# Patient Record
Sex: Female | Born: 1945 | Race: White | Hispanic: No | Marital: Single | Smoking: Current every day smoker
Health system: Southern US, Community
[De-identification: ages and names within clinical notes are randomized; demographics above are authoritative.]

## PROBLEM LIST (undated history)

## (undated) DIAGNOSIS — I10 Essential (primary) hypertension: Secondary | ICD-10-CM

## (undated) DIAGNOSIS — F419 Anxiety disorder, unspecified: Secondary | ICD-10-CM

## (undated) DIAGNOSIS — Z79899 Other long term (current) drug therapy: Secondary | ICD-10-CM

## (undated) DIAGNOSIS — G4700 Insomnia, unspecified: Secondary | ICD-10-CM

## (undated) DIAGNOSIS — K219 Gastro-esophageal reflux disease without esophagitis: Secondary | ICD-10-CM

## (undated) DIAGNOSIS — N189 Chronic kidney disease, unspecified: Secondary | ICD-10-CM

## (undated) DIAGNOSIS — E782 Mixed hyperlipidemia: Secondary | ICD-10-CM

## (undated) HISTORY — PX: OVARIAN CYST REMOVAL: SHX89

---

## 2015-08-03 DIAGNOSIS — I10 Essential (primary) hypertension: Secondary | ICD-10-CM | POA: Insufficient documentation

## 2015-08-03 DIAGNOSIS — F5101 Primary insomnia: Secondary | ICD-10-CM | POA: Insufficient documentation

## 2015-08-03 DIAGNOSIS — Z79899 Other long term (current) drug therapy: Secondary | ICD-10-CM | POA: Insufficient documentation

## 2015-08-03 DIAGNOSIS — R5381 Other malaise: Secondary | ICD-10-CM | POA: Insufficient documentation

## 2015-08-03 DIAGNOSIS — K219 Gastro-esophageal reflux disease without esophagitis: Secondary | ICD-10-CM | POA: Insufficient documentation

## 2015-08-03 DIAGNOSIS — E782 Mixed hyperlipidemia: Secondary | ICD-10-CM | POA: Insufficient documentation

## 2015-08-05 DIAGNOSIS — R5381 Other malaise: Secondary | ICD-10-CM | POA: Diagnosis not present

## 2015-08-05 DIAGNOSIS — K219 Gastro-esophageal reflux disease without esophagitis: Secondary | ICD-10-CM | POA: Diagnosis not present

## 2015-08-05 DIAGNOSIS — I10 Essential (primary) hypertension: Secondary | ICD-10-CM | POA: Diagnosis not present

## 2015-08-05 DIAGNOSIS — E782 Mixed hyperlipidemia: Secondary | ICD-10-CM | POA: Diagnosis not present

## 2015-08-05 DIAGNOSIS — F5101 Primary insomnia: Secondary | ICD-10-CM | POA: Diagnosis not present

## 2015-08-05 DIAGNOSIS — R5383 Other fatigue: Secondary | ICD-10-CM | POA: Diagnosis not present

## 2016-04-12 DIAGNOSIS — R5381 Other malaise: Secondary | ICD-10-CM | POA: Diagnosis not present

## 2016-04-12 DIAGNOSIS — E782 Mixed hyperlipidemia: Secondary | ICD-10-CM | POA: Diagnosis not present

## 2016-04-12 DIAGNOSIS — F5101 Primary insomnia: Secondary | ICD-10-CM | POA: Diagnosis not present

## 2016-04-12 DIAGNOSIS — R5383 Other fatigue: Secondary | ICD-10-CM | POA: Diagnosis not present

## 2016-04-12 DIAGNOSIS — I10 Essential (primary) hypertension: Secondary | ICD-10-CM | POA: Diagnosis not present

## 2016-04-12 DIAGNOSIS — K219 Gastro-esophageal reflux disease without esophagitis: Secondary | ICD-10-CM | POA: Diagnosis not present

## 2017-01-22 DIAGNOSIS — F5101 Primary insomnia: Secondary | ICD-10-CM | POA: Diagnosis not present

## 2017-01-22 DIAGNOSIS — E782 Mixed hyperlipidemia: Secondary | ICD-10-CM | POA: Diagnosis not present

## 2017-01-22 DIAGNOSIS — R5383 Other fatigue: Secondary | ICD-10-CM | POA: Diagnosis not present

## 2017-01-22 DIAGNOSIS — I10 Essential (primary) hypertension: Secondary | ICD-10-CM | POA: Diagnosis not present

## 2017-01-22 DIAGNOSIS — R5381 Other malaise: Secondary | ICD-10-CM | POA: Diagnosis not present

## 2017-01-22 DIAGNOSIS — K219 Gastro-esophageal reflux disease without esophagitis: Secondary | ICD-10-CM | POA: Diagnosis not present

## 2017-09-12 DIAGNOSIS — N183 Chronic kidney disease, stage 3 unspecified: Secondary | ICD-10-CM | POA: Insufficient documentation

## 2017-09-12 DIAGNOSIS — Z79899 Other long term (current) drug therapy: Secondary | ICD-10-CM | POA: Diagnosis not present

## 2017-09-12 DIAGNOSIS — Z0000 Encounter for general adult medical examination without abnormal findings: Secondary | ICD-10-CM | POA: Diagnosis not present

## 2017-09-12 DIAGNOSIS — I129 Hypertensive chronic kidney disease with stage 1 through stage 4 chronic kidney disease, or unspecified chronic kidney disease: Secondary | ICD-10-CM | POA: Diagnosis not present

## 2017-09-12 DIAGNOSIS — K219 Gastro-esophageal reflux disease without esophagitis: Secondary | ICD-10-CM | POA: Diagnosis not present

## 2017-09-12 DIAGNOSIS — R5383 Other fatigue: Secondary | ICD-10-CM | POA: Diagnosis not present

## 2017-09-12 DIAGNOSIS — E782 Mixed hyperlipidemia: Secondary | ICD-10-CM | POA: Diagnosis not present

## 2017-09-12 DIAGNOSIS — R5381 Other malaise: Secondary | ICD-10-CM | POA: Diagnosis not present

## 2017-09-12 DIAGNOSIS — F5101 Primary insomnia: Secondary | ICD-10-CM | POA: Diagnosis not present

## 2017-10-25 DIAGNOSIS — I10 Essential (primary) hypertension: Secondary | ICD-10-CM | POA: Diagnosis not present

## 2017-10-25 DIAGNOSIS — M5489 Other dorsalgia: Secondary | ICD-10-CM | POA: Diagnosis not present

## 2017-10-25 DIAGNOSIS — R52 Pain, unspecified: Secondary | ICD-10-CM | POA: Diagnosis not present

## 2017-10-25 DIAGNOSIS — R262 Difficulty in walking, not elsewhere classified: Secondary | ICD-10-CM | POA: Diagnosis not present

## 2017-10-25 DIAGNOSIS — M545 Low back pain: Secondary | ICD-10-CM | POA: Diagnosis not present

## 2017-10-25 DIAGNOSIS — I491 Atrial premature depolarization: Secondary | ICD-10-CM | POA: Diagnosis not present

## 2017-10-25 DIAGNOSIS — M5136 Other intervertebral disc degeneration, lumbar region: Secondary | ICD-10-CM | POA: Diagnosis not present

## 2017-10-25 DIAGNOSIS — R109 Unspecified abdominal pain: Secondary | ICD-10-CM | POA: Diagnosis not present

## 2017-10-25 DIAGNOSIS — M5116 Intervertebral disc disorders with radiculopathy, lumbar region: Secondary | ICD-10-CM | POA: Diagnosis not present

## 2017-10-25 DIAGNOSIS — M5416 Radiculopathy, lumbar region: Secondary | ICD-10-CM | POA: Diagnosis not present

## 2017-10-30 DIAGNOSIS — M545 Low back pain: Secondary | ICD-10-CM | POA: Diagnosis not present

## 2017-11-01 DIAGNOSIS — M5136 Other intervertebral disc degeneration, lumbar region: Secondary | ICD-10-CM | POA: Insufficient documentation

## 2017-11-01 DIAGNOSIS — I129 Hypertensive chronic kidney disease with stage 1 through stage 4 chronic kidney disease, or unspecified chronic kidney disease: Secondary | ICD-10-CM | POA: Diagnosis not present

## 2017-11-01 DIAGNOSIS — R5381 Other malaise: Secondary | ICD-10-CM | POA: Diagnosis not present

## 2017-11-01 DIAGNOSIS — R5383 Other fatigue: Secondary | ICD-10-CM | POA: Diagnosis not present

## 2017-11-01 DIAGNOSIS — Z79899 Other long term (current) drug therapy: Secondary | ICD-10-CM | POA: Diagnosis not present

## 2017-11-01 DIAGNOSIS — N183 Chronic kidney disease, stage 3 (moderate): Secondary | ICD-10-CM | POA: Diagnosis not present

## 2018-01-17 DIAGNOSIS — M545 Low back pain: Secondary | ICD-10-CM | POA: Diagnosis not present

## 2018-04-07 DIAGNOSIS — M545 Low back pain: Secondary | ICD-10-CM | POA: Diagnosis not present

## 2018-04-07 DIAGNOSIS — S0990XA Unspecified injury of head, initial encounter: Secondary | ICD-10-CM | POA: Diagnosis not present

## 2018-04-07 DIAGNOSIS — M25552 Pain in left hip: Secondary | ICD-10-CM | POA: Diagnosis not present

## 2018-04-07 DIAGNOSIS — M25551 Pain in right hip: Secondary | ICD-10-CM | POA: Diagnosis not present

## 2018-04-07 DIAGNOSIS — I10 Essential (primary) hypertension: Secondary | ICD-10-CM | POA: Diagnosis not present

## 2018-04-07 DIAGNOSIS — R262 Difficulty in walking, not elsewhere classified: Secondary | ICD-10-CM | POA: Diagnosis not present

## 2018-04-07 DIAGNOSIS — W19XXXA Unspecified fall, initial encounter: Secondary | ICD-10-CM | POA: Diagnosis not present

## 2018-04-07 DIAGNOSIS — R531 Weakness: Secondary | ICD-10-CM | POA: Diagnosis not present

## 2018-04-12 DIAGNOSIS — M5136 Other intervertebral disc degeneration, lumbar region: Secondary | ICD-10-CM | POA: Diagnosis not present

## 2018-04-12 DIAGNOSIS — I10 Essential (primary) hypertension: Secondary | ICD-10-CM | POA: Diagnosis not present

## 2018-04-12 DIAGNOSIS — F5101 Primary insomnia: Secondary | ICD-10-CM | POA: Diagnosis not present

## 2018-04-16 DIAGNOSIS — M5136 Other intervertebral disc degeneration, lumbar region: Secondary | ICD-10-CM | POA: Diagnosis not present

## 2018-04-16 DIAGNOSIS — K219 Gastro-esophageal reflux disease without esophagitis: Secondary | ICD-10-CM | POA: Diagnosis not present

## 2018-04-16 DIAGNOSIS — I129 Hypertensive chronic kidney disease with stage 1 through stage 4 chronic kidney disease, or unspecified chronic kidney disease: Secondary | ICD-10-CM | POA: Diagnosis not present

## 2018-04-16 DIAGNOSIS — R5381 Other malaise: Secondary | ICD-10-CM | POA: Diagnosis not present

## 2018-04-16 DIAGNOSIS — N183 Chronic kidney disease, stage 3 (moderate): Secondary | ICD-10-CM | POA: Diagnosis not present

## 2018-04-16 DIAGNOSIS — F5101 Primary insomnia: Secondary | ICD-10-CM | POA: Diagnosis not present

## 2018-04-16 DIAGNOSIS — R5383 Other fatigue: Secondary | ICD-10-CM | POA: Diagnosis not present

## 2018-04-17 DIAGNOSIS — Z79891 Long term (current) use of opiate analgesic: Secondary | ICD-10-CM | POA: Diagnosis not present

## 2018-04-17 DIAGNOSIS — I129 Hypertensive chronic kidney disease with stage 1 through stage 4 chronic kidney disease, or unspecified chronic kidney disease: Secondary | ICD-10-CM | POA: Diagnosis not present

## 2018-04-17 DIAGNOSIS — M5136 Other intervertebral disc degeneration, lumbar region: Secondary | ICD-10-CM | POA: Diagnosis not present

## 2018-04-17 DIAGNOSIS — F17210 Nicotine dependence, cigarettes, uncomplicated: Secondary | ICD-10-CM | POA: Diagnosis not present

## 2018-04-17 DIAGNOSIS — G4700 Insomnia, unspecified: Secondary | ICD-10-CM | POA: Diagnosis not present

## 2018-04-17 DIAGNOSIS — I700 Atherosclerosis of aorta: Secondary | ICD-10-CM | POA: Diagnosis not present

## 2018-04-17 DIAGNOSIS — Z9181 History of falling: Secondary | ICD-10-CM | POA: Diagnosis not present

## 2018-04-17 DIAGNOSIS — E782 Mixed hyperlipidemia: Secondary | ICD-10-CM | POA: Diagnosis not present

## 2018-04-17 DIAGNOSIS — N183 Chronic kidney disease, stage 3 (moderate): Secondary | ICD-10-CM | POA: Diagnosis not present

## 2018-04-22 DIAGNOSIS — M5136 Other intervertebral disc degeneration, lumbar region: Secondary | ICD-10-CM | POA: Diagnosis not present

## 2018-04-22 DIAGNOSIS — M5116 Intervertebral disc disorders with radiculopathy, lumbar region: Secondary | ICD-10-CM | POA: Diagnosis not present

## 2018-04-22 DIAGNOSIS — M5126 Other intervertebral disc displacement, lumbar region: Secondary | ICD-10-CM | POA: Diagnosis not present

## 2018-04-22 DIAGNOSIS — M48061 Spinal stenosis, lumbar region without neurogenic claudication: Secondary | ICD-10-CM | POA: Diagnosis not present

## 2018-04-25 DIAGNOSIS — M5136 Other intervertebral disc degeneration, lumbar region: Secondary | ICD-10-CM | POA: Diagnosis not present

## 2018-04-26 DIAGNOSIS — M48061 Spinal stenosis, lumbar region without neurogenic claudication: Secondary | ICD-10-CM | POA: Insufficient documentation

## 2018-04-26 DIAGNOSIS — G8929 Other chronic pain: Secondary | ICD-10-CM | POA: Insufficient documentation

## 2018-04-26 DIAGNOSIS — M545 Low back pain: Secondary | ICD-10-CM | POA: Diagnosis not present

## 2018-04-26 DIAGNOSIS — M549 Dorsalgia, unspecified: Secondary | ICD-10-CM | POA: Insufficient documentation

## 2018-04-26 DIAGNOSIS — M5136 Other intervertebral disc degeneration, lumbar region: Secondary | ICD-10-CM | POA: Diagnosis not present

## 2018-04-26 DIAGNOSIS — F419 Anxiety disorder, unspecified: Secondary | ICD-10-CM | POA: Insufficient documentation

## 2018-05-08 ENCOUNTER — Other Ambulatory Visit: Payer: Self-pay | Admitting: Neurosurgery

## 2018-05-08 DIAGNOSIS — M4310 Spondylolisthesis, site unspecified: Secondary | ICD-10-CM | POA: Diagnosis not present

## 2018-05-15 DIAGNOSIS — E782 Mixed hyperlipidemia: Secondary | ICD-10-CM | POA: Diagnosis not present

## 2018-05-15 DIAGNOSIS — I129 Hypertensive chronic kidney disease with stage 1 through stage 4 chronic kidney disease, or unspecified chronic kidney disease: Secondary | ICD-10-CM | POA: Diagnosis not present

## 2018-05-15 DIAGNOSIS — F419 Anxiety disorder, unspecified: Secondary | ICD-10-CM | POA: Diagnosis not present

## 2018-05-15 DIAGNOSIS — M5136 Other intervertebral disc degeneration, lumbar region: Secondary | ICD-10-CM | POA: Diagnosis not present

## 2018-05-15 DIAGNOSIS — R5383 Other fatigue: Secondary | ICD-10-CM | POA: Diagnosis not present

## 2018-05-15 DIAGNOSIS — R5381 Other malaise: Secondary | ICD-10-CM | POA: Diagnosis not present

## 2018-05-15 DIAGNOSIS — F5101 Primary insomnia: Secondary | ICD-10-CM | POA: Diagnosis not present

## 2018-05-15 DIAGNOSIS — Z79899 Other long term (current) drug therapy: Secondary | ICD-10-CM | POA: Diagnosis not present

## 2018-05-15 DIAGNOSIS — K219 Gastro-esophageal reflux disease without esophagitis: Secondary | ICD-10-CM | POA: Diagnosis not present

## 2018-05-15 DIAGNOSIS — N183 Chronic kidney disease, stage 3 (moderate): Secondary | ICD-10-CM | POA: Diagnosis not present

## 2018-05-16 NOTE — Pre-Procedure Instructions (Signed)
Natalie Pollard  05/16/2018      [id number] - [place], [place] - [address] AT [facility] [address] [place] [address] Phone: [phone] Fax: [phone]    Your procedure is scheduled on Friday March 27th.  Report to [facility] Admitting Entrance "A" at 9:00 A.M.  Call this number if you have problems the morning of surgery:  [phone]   Remember:  Do not eat or drink after midnight.     Take these medicines the morning of surgery with A SIP OF WATER  amLODipine (NORVASC) cloNIDine (CATAPRES) cyclobenzaprine (FLEXERIL) if needed oxyCODONE (OXY IR/ROXICODONE) if needed oxymetazoline (AFRIN) if needed  7 days prior to surgery STOP taking any Aspirin(unless otherwise instructed by your surgeon), meloxicam (MOBIC), Aleve, Naproxen, Ibuprofen, Motrin, Advil, Goody's, BC's, all herbal medications, fish oil, and all vitamins    Do not wear jewelry, make-up or nail polish.  Do not wear lotions, powders, or perfumes, or deodorant.  Do not shave 48 hours prior to surgery.  Do not bring valuables to the hospital.  [facility] is not responsible for any belongings or valuables.  Contacts, dentures or bridgework may not be worn into surgery.  Leave your suitcase in the car.  After surgery it may be brought to your room.  For patients admitted to the hospital, discharge time will be determined by your treatment team.  Patients discharged the day of surgery will not be allowed to drive home.   [place]- Preparing For Surgery  Before surgery, you can play an important role. Because skin is not sterile, your skin needs to be as free of germs as possible. You can reduce the number of germs on your skin by washing with CHG (chlorahexidine gluconate) Soap before surgery.  CHG is an antiseptic cleaner which kills germs and bonds with the skin to continue killing germs even after washing.    Oral Hygiene is also important to reduce your  risk of infection.  Remember - BRUSH YOUR TEETH THE MORNING OF SURGERY WITH YOUR REGULAR TOOTHPASTE  Please do not use if you have an allergy to CHG or antibacterial soaps. If your skin becomes reddened/irritated stop using the CHG.  Do not shave (including legs and underarms) for at least 48 hours prior to first CHG shower. It is OK to shave your face.  Please follow these instructions carefully.   1. Shower the NIGHT BEFORE SURGERY and the MORNING OF SURGERY with CHG.   2. If you chose to wash your hair, wash your hair first as usual with your normal shampoo.  3. After you shampoo, rinse your hair and body thoroughly to remove the shampoo.  4. Use CHG as you would any other liquid soap. You can apply CHG directly to the skin and wash gently with a scrungie or a clean washcloth.   5. Apply the CHG Soap to your body ONLY FROM THE NECK DOWN.  Do not use on open wounds or open sores. Avoid contact with your eyes, ears, mouth and genitals (private parts). Wash Face and genitals (private parts)  with your normal soap.  6. Wash thoroughly, paying special attention to the area where your surgery will be performed.  7. Thoroughly rinse your body with warm water from the neck down.  8. DO NOT shower/wash with your normal soap after using and rinsing off the CHG Soap.  9. Pat yourself dry with a CLEAN TOWEL.  10. Wear CLEAN  PAJAMAS to bed the night before surgery, wear comfortable clothes the morning of surgery  11. Place CLEAN SHEETS on your bed the night of your first shower and DO NOT SLEEP WITH PETS.    Day of Surgery: Shower as stated above. Do not apply any deodorants/lotions.  Please wear clean clothes to the hospital/surgery center.   Remember to brush your teeth WITH YOUR REGULAR TOOTHPASTE.   Please read over the following fact sheets that you were given.

## 2018-05-17 ENCOUNTER — Encounter (HOSPITAL_COMMUNITY): Payer: Self-pay

## 2018-05-17 ENCOUNTER — Other Ambulatory Visit: Payer: Self-pay

## 2018-05-17 ENCOUNTER — Encounter (HOSPITAL_COMMUNITY)
Admission: RE | Admit: 2018-05-17 | Discharge: 2018-05-17 | Disposition: A | Discharge status: Home / Self Care | Payer: PPO | Source: Ambulatory Visit | Attending: Neurosurgery | Admitting: Neurosurgery

## 2018-05-17 DIAGNOSIS — I10 Essential (primary) hypertension: Secondary | ICD-10-CM | POA: Diagnosis not present

## 2018-05-17 DIAGNOSIS — Z01818 Encounter for other preprocedural examination: Secondary | ICD-10-CM | POA: Diagnosis not present

## 2018-05-17 HISTORY — DX: Chronic kidney disease, unspecified: N18.9

## 2018-05-17 HISTORY — DX: Gastro-esophageal reflux disease without esophagitis: K21.9

## 2018-05-17 HISTORY — DX: Anxiety disorder, unspecified: F41.9

## 2018-05-17 HISTORY — DX: Other long term (current) drug therapy: Z79.899

## 2018-05-17 HISTORY — DX: Insomnia, unspecified: G47.00

## 2018-05-17 HISTORY — DX: Mixed hyperlipidemia: E78.2

## 2018-05-17 HISTORY — DX: Essential (primary) hypertension: I10

## 2018-05-17 LAB — CBC WITH DIFFERENTIAL/PLATELET
Abs Immature Granulocytes: 0.05 10*3/uL (ref 0.00–0.07)
Basophils Absolute: 0.1 10*3/uL (ref 0.0–0.1)
Basophils Relative: 1 %
Eosinophils Absolute: 0.1 10*3/uL (ref 0.0–0.5)
Eosinophils Relative: 1 %
HEMATOCRIT: 39.9 % (ref 36.0–46.0)
Hemoglobin: 12.8 g/dL (ref 12.0–15.0)
Immature Granulocytes: 1 %
LYMPHS ABS: 2 10*3/uL (ref 0.7–4.0)
Lymphocytes Relative: 26 %
MCH: 29.1 pg (ref 26.0–34.0)
MCHC: 32.1 g/dL (ref 30.0–36.0)
MCV: 90.7 fL (ref 80.0–100.0)
Monocytes Absolute: 0.4 10*3/uL (ref 0.1–1.0)
Monocytes Relative: 5 %
Neutro Abs: 5.1 10*3/uL (ref 1.7–7.7)
Neutrophils Relative %: 66 %
Platelets: 337 10*3/uL (ref 150–400)
RBC: 4.4 MIL/uL (ref 3.87–5.11)
RDW: 13.2 % (ref 11.5–15.5)
WBC: 7.7 10*3/uL (ref 4.0–10.5)
nRBC: 0 % (ref 0.0–0.2)

## 2018-05-17 LAB — BASIC METABOLIC PANEL
Anion gap: 12 (ref 5–15)
BUN: 11 mg/dL (ref 8–23)
CO2: 20 mmol/L — AB (ref 22–32)
Calcium: 9.4 mg/dL (ref 8.9–10.3)
Chloride: 103 mmol/L (ref 98–111)
Creatinine, Ser: 1.51 mg/dL — ABNORMAL HIGH (ref 0.44–1.00)
GFR calc Af Amer: 39 mL/min — ABNORMAL LOW (ref >60)
GFR calc non Af Amer: 34 mL/min — ABNORMAL LOW (ref >60)
GLUCOSE: 97 mg/dL (ref 70–99)
Potassium: 3.9 mmol/L (ref 3.5–5.1)
Sodium: 135 mmol/L (ref 135–145)

## 2018-05-17 LAB — TYPE AND SCREEN
ABO/RH(D): A POS
Antibody Screen: NEGATIVE

## 2018-05-17 LAB — ABO/RH: ABO/RH(D): A POS

## 2018-05-17 LAB — SURGICAL PCR SCREEN
MRSA, PCR: NEGATIVE
Staphylococcus aureus: NEGATIVE

## 2018-05-17 NOTE — Progress Notes (Signed)
PCP - Adela Lank Cardiologist - denies  Chest x-ray - N/A EKG - 05/17/18 Stress Test - denies ECHO - denies Cardiac Cath - denies  Sleep Study - denies  Aspirin Instructions: Patient instructed to hold all Aspirin, NSAID's, herbal medications, fish oil and vitamins 7 days prior to surgery.   Anesthesia review:   Patient denies shortness of breath, fever, cough and chest pain at PAT appointment   Patient verbalized understanding of instructions that were given to them at the PAT appointment. Patient was also instructed that they will need to review over the PAT instructions again at home before surgery.

## 2018-05-23 NOTE — Anesthesia Preprocedure Evaluation (Addendum)
Anesthesia Evaluation  Patient identified by MRN, date of birth, ID band Patient awake    Reviewed: Allergy & Precautions, NPO status , Patient's Chart, lab work & pertinent test results  History of Anesthesia Complications Negative for: history of anesthetic complications  Airway Mallampati: II  TM Distance: >3 FB Neck ROM: Full    Dental no notable dental hx.    Pulmonary Current Smoker,    Pulmonary exam normal        Cardiovascular hypertension, negative cardio ROS Normal cardiovascular exam     Neuro/Psych PSYCHIATRIC DISORDERS Anxiety negative neurological ROS     GI/Hepatic Neg liver ROS, GERD  ,  Endo/Other  negative endocrine ROS  Renal/GU Renal InsufficiencyRenal disease  negative genitourinary   Musculoskeletal negative musculoskeletal ROS (+)   Abdominal   Peds  Hematology negative hematology ROS (+)   Anesthesia Other Findings L4-5 PLIF - Anxiety, current smoker, HTN, HLD, CKD3 (Cr 1.51), GERD  Reproductive/Obstetrics                            Anesthesia Physical Anesthesia Plan  ASA: III  Anesthesia Plan: General   Post-op Pain Management:    Induction: Intravenous and Rapid sequence  PONV Risk Score and Plan: 2 and Ondansetron, Dexamethasone, Midazolam and Treatment may vary due to age or medical condition  Airway Management Planned: Oral ETT  Additional Equipment: None  Intra-op Plan:   Post-operative Plan: Extubation in OR  Informed Consent: I have reviewed the patients History and Physical, chart, labs and discussed the procedure including the risks, benefits and alternatives for the proposed anesthesia with the patient or authorized representative who has indicated his/her understanding and acceptance.     Dental advisory given  Plan Discussed with:   Anesthesia Plan Comments:        Anesthesia Quick Evaluation

## 2018-05-24 ENCOUNTER — Inpatient Hospital Stay (HOSPITAL_COMMUNITY): Payer: PPO | Admitting: Physician Assistant

## 2018-05-24 ENCOUNTER — Other Ambulatory Visit: Payer: Self-pay

## 2018-05-24 ENCOUNTER — Inpatient Hospital Stay (HOSPITAL_COMMUNITY): Payer: PPO

## 2018-05-24 ENCOUNTER — Inpatient Hospital Stay (HOSPITAL_COMMUNITY)
Admission: RE | Admit: 2018-05-24 | Discharge: 2018-05-25 | DRG: 455 | Disposition: A | Discharge status: Home Health | Payer: PPO | Attending: Neurosurgery | Admitting: Neurosurgery

## 2018-05-24 ENCOUNTER — Inpatient Hospital Stay (HOSPITAL_COMMUNITY): Payer: PPO | Admitting: Anesthesiology

## 2018-05-24 ENCOUNTER — Encounter (HOSPITAL_COMMUNITY)
Admission: RE | Disposition: A | Discharge status: Home Health | Payer: Self-pay | Source: Home / Self Care | Attending: Neurosurgery

## 2018-05-24 ENCOUNTER — Encounter (HOSPITAL_COMMUNITY): Payer: Self-pay | Admitting: *Deleted

## 2018-05-24 DIAGNOSIS — E782 Mixed hyperlipidemia: Secondary | ICD-10-CM | POA: Diagnosis not present

## 2018-05-24 DIAGNOSIS — K219 Gastro-esophageal reflux disease without esophagitis: Secondary | ICD-10-CM | POA: Diagnosis present

## 2018-05-24 DIAGNOSIS — N183 Chronic kidney disease, stage 3 (moderate): Secondary | ICD-10-CM | POA: Diagnosis not present

## 2018-05-24 DIAGNOSIS — Z419 Encounter for procedure for purposes other than remedying health state, unspecified: Secondary | ICD-10-CM

## 2018-05-24 DIAGNOSIS — Z881 Allergy status to other antibiotic agents status: Secondary | ICD-10-CM | POA: Diagnosis not present

## 2018-05-24 DIAGNOSIS — Z885 Allergy status to narcotic agent status: Secondary | ICD-10-CM

## 2018-05-24 DIAGNOSIS — I129 Hypertensive chronic kidney disease with stage 1 through stage 4 chronic kidney disease, or unspecified chronic kidney disease: Secondary | ICD-10-CM | POA: Diagnosis present

## 2018-05-24 DIAGNOSIS — G4700 Insomnia, unspecified: Secondary | ICD-10-CM | POA: Diagnosis not present

## 2018-05-24 DIAGNOSIS — M4316 Spondylolisthesis, lumbar region: Principal | ICD-10-CM | POA: Diagnosis present

## 2018-05-24 DIAGNOSIS — M5416 Radiculopathy, lumbar region: Secondary | ICD-10-CM | POA: Diagnosis present

## 2018-05-24 DIAGNOSIS — Z791 Long term (current) use of non-steroidal anti-inflammatories (NSAID): Secondary | ICD-10-CM

## 2018-05-24 DIAGNOSIS — M4310 Spondylolisthesis, site unspecified: Secondary | ICD-10-CM | POA: Diagnosis present

## 2018-05-24 DIAGNOSIS — M48061 Spinal stenosis, lumbar region without neurogenic claudication: Secondary | ICD-10-CM | POA: Diagnosis not present

## 2018-05-24 DIAGNOSIS — M549 Dorsalgia, unspecified: Secondary | ICD-10-CM | POA: Diagnosis present

## 2018-05-24 DIAGNOSIS — M5126 Other intervertebral disc displacement, lumbar region: Secondary | ICD-10-CM | POA: Diagnosis not present

## 2018-05-24 SURGERY — POSTERIOR LUMBAR FUSION 1 LEVEL
Anesthesia: General | Site: Back

## 2018-05-24 MED ORDER — BUPIVACAINE HCL (PF) 0.25 % IJ SOLN
INTRAMUSCULAR | Status: DC | PRN
  Administered 2018-05-24: 20 mL

## 2018-05-24 MED ORDER — LOSARTAN POTASSIUM 50 MG PO TABS
100.0000 mg | ORAL_TABLET | Freq: Every day | ORAL | Status: DC
  Administered 2018-05-25: 100 mg via ORAL
  Filled 2018-05-24: qty 2

## 2018-05-24 MED ORDER — HYDROCODONE-ACETAMINOPHEN 10-325 MG PO TABS
1.0000 | ORAL_TABLET | ORAL | Status: DC | PRN
  Administered 2018-05-24: 1 via ORAL
  Filled 2018-05-24: qty 1

## 2018-05-24 MED ORDER — BUPIVACAINE HCL (PF) 0.25 % IJ SOLN
INTRAMUSCULAR | Status: AC
  Filled 2018-05-24: qty 30

## 2018-05-24 MED ORDER — VANCOMYCIN HCL 1000 MG IV SOLR
INTRAVENOUS | Status: AC
  Filled 2018-05-24: qty 1000

## 2018-05-24 MED ORDER — SODIUM CHLORIDE 0.9% FLUSH: 3.0000 mL | INTRAVENOUS | Status: DC | PRN

## 2018-05-24 MED ORDER — HYDROCHLOROTHIAZIDE 12.5 MG PO CAPS
12.5000 mg | ORAL_CAPSULE | Freq: Every day | ORAL | Status: DC
  Administered 2018-05-25: 12.5 mg via ORAL
  Filled 2018-05-24: qty 1

## 2018-05-24 MED ORDER — ACETAMINOPHEN 325 MG PO TABS: 650.0000 mg | ORAL_TABLET | ORAL | Status: DC | PRN

## 2018-05-24 MED ORDER — FENTANYL CITRATE (PF) 100 MCG/2ML IJ SOLN
INTRAMUSCULAR | Status: DC | PRN
  Administered 2018-05-24: 100 ug via INTRAVENOUS
  Administered 2018-05-24 (×2): 50 ug via INTRAVENOUS

## 2018-05-24 MED ORDER — MENTHOL 3 MG MT LOZG: 1.0000 | LOZENGE | OROMUCOSAL | Status: DC | PRN

## 2018-05-24 MED ORDER — OXYCODONE HCL 5 MG PO TABS
ORAL_TABLET | ORAL | Status: AC
  Filled 2018-05-24: qty 2

## 2018-05-24 MED ORDER — POLYETHYLENE GLYCOL 3350 17 G PO PACK: 17.0000 g | PACK | Freq: Every day | ORAL | Status: DC | PRN

## 2018-05-24 MED ORDER — CHLORHEXIDINE GLUCONATE CLOTH 2 % EX PADS: 6.0000 | MEDICATED_PAD | Freq: Once | CUTANEOUS | Status: DC

## 2018-05-24 MED ORDER — FENTANYL CITRATE (PF) 100 MCG/2ML IJ SOLN
25.0000 ug | INTRAMUSCULAR | Status: DC | PRN
  Administered 2018-05-24 (×3): 50 ug via INTRAVENOUS

## 2018-05-24 MED ORDER — CYCLOBENZAPRINE HCL 10 MG PO TABS
10.0000 mg | ORAL_TABLET | Freq: Three times a day (TID) | ORAL | Status: DC | PRN
  Administered 2018-05-24 – 2018-05-25 (×2): 10 mg via ORAL
  Filled 2018-05-24 (×3): qty 1

## 2018-05-24 MED ORDER — SODIUM CHLORIDE 0.9 % IV SOLN
INTRAVENOUS | Status: DC | PRN
  Administered 2018-05-24: 09:00:00

## 2018-05-24 MED ORDER — CEFAZOLIN SODIUM-DEXTROSE 2-4 GM/100ML-% IV SOLN
2.0000 g | INTRAVENOUS | Status: AC
  Administered 2018-05-24: 2 g via INTRAVENOUS
  Filled 2018-05-24: qty 100

## 2018-05-24 MED ORDER — FENTANYL CITRATE (PF) 100 MCG/2ML IJ SOLN
INTRAMUSCULAR | Status: AC
  Administered 2018-05-24: 50 ug via INTRAVENOUS
  Filled 2018-05-24: qty 2

## 2018-05-24 MED ORDER — AMLODIPINE BESYLATE 10 MG PO TABS
10.0000 mg | ORAL_TABLET | Freq: Every day | ORAL | Status: DC
  Administered 2018-05-25: 10 mg via ORAL
  Filled 2018-05-24: qty 1

## 2018-05-24 MED ORDER — ONDANSETRON HCL 4 MG PO TABS
4.0000 mg | ORAL_TABLET | Freq: Four times a day (QID) | ORAL | Status: DC | PRN
  Administered 2018-05-25: 4 mg via ORAL
  Filled 2018-05-24: qty 1

## 2018-05-24 MED ORDER — FENTANYL CITRATE (PF) 250 MCG/5ML IJ SOLN
INTRAMUSCULAR | Status: AC
  Filled 2018-05-24: qty 5

## 2018-05-24 MED ORDER — ONDANSETRON HCL 4 MG/2ML IJ SOLN
INTRAMUSCULAR | Status: DC | PRN
  Administered 2018-05-24: 4 mg via INTRAVENOUS

## 2018-05-24 MED ORDER — ROCURONIUM BROMIDE 50 MG/5ML IV SOSY
PREFILLED_SYRINGE | INTRAVENOUS | Status: DC | PRN
  Administered 2018-05-24: 50 mg via INTRAVENOUS
  Administered 2018-05-24: 20 mg via INTRAVENOUS

## 2018-05-24 MED ORDER — ACETAMINOPHEN 10 MG/ML IV SOLN
INTRAVENOUS | Status: AC
  Filled 2018-05-24: qty 100

## 2018-05-24 MED ORDER — FLEET ENEMA 7-19 GM/118ML RE ENEM: 1.0000 | ENEMA | Freq: Once | RECTAL | Status: DC | PRN

## 2018-05-24 MED ORDER — CEFAZOLIN SODIUM-DEXTROSE 1-4 GM/50ML-% IV SOLN
1.0000 g | Freq: Three times a day (TID) | INTRAVENOUS | Status: AC
  Administered 2018-05-24 – 2018-05-25 (×2): 1 g via INTRAVENOUS
  Filled 2018-05-24 (×2): qty 50

## 2018-05-24 MED ORDER — DIAZEPAM 5 MG PO TABS
5.0000 mg | ORAL_TABLET | Freq: Four times a day (QID) | ORAL | Status: DC | PRN
  Administered 2018-05-25: 10 mg via ORAL
  Filled 2018-05-24: qty 2

## 2018-05-24 MED ORDER — ONDANSETRON HCL 4 MG/2ML IJ SOLN: 4.0000 mg | Freq: Once | INTRAMUSCULAR | Status: DC | PRN

## 2018-05-24 MED ORDER — OXYCODONE HCL 5 MG/5ML PO SOLN: 5.0000 mg | Freq: Once | ORAL | Status: DC | PRN

## 2018-05-24 MED ORDER — SODIUM CHLORIDE 0.9 % IV SOLN: 250.0000 mL | INTRAVENOUS | Status: DC

## 2018-05-24 MED ORDER — ACETAMINOPHEN 10 MG/ML IV SOLN
INTRAVENOUS | Status: DC | PRN
  Administered 2018-05-24: 1000 mg via INTRAVENOUS

## 2018-05-24 MED ORDER — LACTATED RINGERS IV SOLN
INTRAVENOUS | Status: DC | PRN
  Administered 2018-05-24: 07:00:00 via INTRAVENOUS

## 2018-05-24 MED ORDER — VANCOMYCIN HCL 1 G IV SOLR
INTRAVENOUS | Status: DC | PRN
  Administered 2018-05-24: 1000 mg via TOPICAL

## 2018-05-24 MED ORDER — DEXAMETHASONE SODIUM PHOSPHATE 10 MG/ML IJ SOLN
10.0000 mg | INTRAMUSCULAR | Status: AC
  Administered 2018-05-24: 10 mg via INTRAVENOUS
  Filled 2018-05-24: qty 1

## 2018-05-24 MED ORDER — ONDANSETRON HCL 4 MG/2ML IJ SOLN
4.0000 mg | Freq: Four times a day (QID) | INTRAMUSCULAR | Status: DC | PRN
  Administered 2018-05-24 – 2018-05-25 (×2): 4 mg via INTRAVENOUS
  Filled 2018-05-24 (×2): qty 2

## 2018-05-24 MED ORDER — ORAL CARE MOUTH RINSE
15.0000 mL | Freq: Two times a day (BID) | OROMUCOSAL | Status: DC
  Administered 2018-05-24: 15 mL via OROMUCOSAL

## 2018-05-24 MED ORDER — HYDROMORPHONE HCL 1 MG/ML IJ SOLN
1.0000 mg | INTRAMUSCULAR | Status: DC | PRN
  Administered 2018-05-24 (×3): 1 mg via INTRAVENOUS
  Filled 2018-05-24 (×3): qty 1

## 2018-05-24 MED ORDER — SODIUM CHLORIDE 0.9% FLUSH
3.0000 mL | Freq: Two times a day (BID) | INTRAVENOUS | Status: DC
  Administered 2018-05-24: 3 mL via INTRAVENOUS

## 2018-05-24 MED ORDER — ACETAMINOPHEN 650 MG RE SUPP: 650.0000 mg | RECTAL | Status: DC | PRN

## 2018-05-24 MED ORDER — SUGAMMADEX SODIUM 200 MG/2ML IV SOLN
INTRAVENOUS | Status: DC | PRN
  Administered 2018-05-24 (×2): 100 mg via INTRAVENOUS

## 2018-05-24 MED ORDER — CLONIDINE HCL 0.1 MG PO TABS
0.0500 mg | ORAL_TABLET | Freq: Two times a day (BID) | ORAL | Status: DC
  Administered 2018-05-24: 0.5 mg via ORAL
  Administered 2018-05-25: 0.05 mg via ORAL
  Filled 2018-05-24 (×2): qty 1

## 2018-05-24 MED ORDER — OXYCODONE HCL 5 MG PO TABS: 5.0000 mg | ORAL_TABLET | Freq: Once | ORAL | Status: DC | PRN

## 2018-05-24 MED ORDER — PROPOFOL 10 MG/ML IV BOLUS
INTRAVENOUS | Status: AC
  Filled 2018-05-24: qty 20

## 2018-05-24 MED ORDER — PHENOL 1.4 % MT LIQD: 1.0000 | OROMUCOSAL | Status: DC | PRN

## 2018-05-24 MED ORDER — PROPOFOL 10 MG/ML IV BOLUS
INTRAVENOUS | Status: DC | PRN
  Administered 2018-05-24: 140 mg via INTRAVENOUS

## 2018-05-24 MED ORDER — SUCCINYLCHOLINE CHLORIDE 20 MG/ML IJ SOLN
INTRAMUSCULAR | Status: DC | PRN
  Administered 2018-05-24: 80 mg via INTRAVENOUS

## 2018-05-24 MED ORDER — THROMBIN 20000 UNITS EX SOLR
CUTANEOUS | Status: DC | PRN
  Administered 2018-05-24: 09:00:00 via TOPICAL

## 2018-05-24 MED ORDER — BISACODYL 10 MG RE SUPP: 10.0000 mg | Freq: Every day | RECTAL | Status: DC | PRN

## 2018-05-24 MED ORDER — THROMBIN 20000 UNITS EX SOLR
CUTANEOUS | Status: AC
  Filled 2018-05-24: qty 20000

## 2018-05-24 MED ORDER — OXYCODONE HCL 5 MG PO TABS
10.0000 mg | ORAL_TABLET | ORAL | Status: DC | PRN
  Administered 2018-05-24 – 2018-05-25 (×6): 10 mg via ORAL
  Filled 2018-05-24 (×6): qty 2

## 2018-05-24 MED ORDER — OXYMETAZOLINE HCL 0.05 % NA SOLN: 1.0000 | Freq: Two times a day (BID) | NASAL | Status: DC | PRN

## 2018-05-24 MED ORDER — CLONAZEPAM 0.5 MG PO TABS
0.5000 mg | ORAL_TABLET | Freq: Every day | ORAL | Status: DC
  Administered 2018-05-24: 0.5 mg via ORAL
  Filled 2018-05-24: qty 1

## 2018-05-24 MED ORDER — LIDOCAINE 2% (20 MG/ML) 5 ML SYRINGE
INTRAMUSCULAR | Status: DC | PRN
  Administered 2018-05-24: 80 mg via INTRAVENOUS

## 2018-05-24 MED ORDER — 0.9 % SODIUM CHLORIDE (POUR BTL) OPTIME
TOPICAL | Status: DC | PRN
  Administered 2018-05-24: 1000 mL

## 2018-05-24 SURGICAL SUPPLY — 58 items
BAG DECANTER FOR FLEXI CONT (MISCELLANEOUS) ×3 IMPLANT
BENZOIN TINCTURE PRP APPL 2/3 (GAUZE/BANDAGES/DRESSINGS) ×3 IMPLANT
BLADE CLIPPER SURG (BLADE) IMPLANT
BUR CUTTER 7.0 ROUND (BURR) IMPLANT
BUR MATCHSTICK NEURO 3.0 LAGG (BURR) ×3 IMPLANT
CANISTER SUCT 3000ML PPV (MISCELLANEOUS) ×3 IMPLANT
CAP LCK SPNE (Orthopedic Implant) ×4 IMPLANT
CAP LOCK SPINE RADIUS (Orthopedic Implant) ×4 IMPLANT
CAP LOCKING (Orthopedic Implant) ×8 IMPLANT
CARTRIDGE OIL MAESTRO DRILL (MISCELLANEOUS) ×1 IMPLANT
CLOSURE WOUND 1/2 X4 (GAUZE/BANDAGES/DRESSINGS) ×1
CONT SPEC 4OZ CLIKSEAL STRL BL (MISCELLANEOUS) ×3 IMPLANT
COVER BACK TABLE 60X90IN (DRAPES) ×3 IMPLANT
COVER WAND RF STERILE (DRAPES) ×3 IMPLANT
DECANTER SPIKE VIAL GLASS SM (MISCELLANEOUS) ×3 IMPLANT
DERMABOND ADVANCED (GAUZE/BANDAGES/DRESSINGS) ×2
DERMABOND ADVANCED .7 DNX12 (GAUZE/BANDAGES/DRESSINGS) ×1 IMPLANT
DEVICE INTERBODY ELEVATE 10X23 (Cage) ×6 IMPLANT
DIFFUSER DRILL AIR PNEUMATIC (MISCELLANEOUS) ×3 IMPLANT
DRAPE C-ARM 42X72 X-RAY (DRAPES) ×6 IMPLANT
DRAPE HALF SHEET 40X57 (DRAPES) IMPLANT
DRAPE LAPAROTOMY 100X72X124 (DRAPES) ×3 IMPLANT
DRAPE SURG 17X23 STRL (DRAPES) ×12 IMPLANT
DRSG OPSITE POSTOP 4X6 (GAUZE/BANDAGES/DRESSINGS) ×3 IMPLANT
DURAPREP 26ML APPLICATOR (WOUND CARE) ×3 IMPLANT
ELECT REM PT RETURN 9FT ADLT (ELECTROSURGICAL) ×3
ELECTRODE REM PT RTRN 9FT ADLT (ELECTROSURGICAL) ×1 IMPLANT
EVACUATOR 1/8 PVC DRAIN (DRAIN) IMPLANT
GAUZE 4X4 16PLY RFD (DISPOSABLE) IMPLANT
GAUZE SPONGE 4X4 12PLY STRL (GAUZE/BANDAGES/DRESSINGS) IMPLANT
GLOVE ECLIPSE 9.0 STRL (GLOVE) ×6 IMPLANT
GLOVE EXAM NITRILE XL STR (GLOVE) IMPLANT
GOWN STRL REUS W/ TWL LRG LVL3 (GOWN DISPOSABLE) IMPLANT
GOWN STRL REUS W/ TWL XL LVL3 (GOWN DISPOSABLE) ×2 IMPLANT
GOWN STRL REUS W/TWL 2XL LVL3 (GOWN DISPOSABLE) IMPLANT
GOWN STRL REUS W/TWL LRG LVL3 (GOWN DISPOSABLE)
GOWN STRL REUS W/TWL XL LVL3 (GOWN DISPOSABLE) ×4
GRAFT BN 5X1XSPNE CVD POST DBM (Bone Implant) ×1 IMPLANT
GRAFT BONE MAGNIFUSE 1X5CM (Bone Implant) ×2 IMPLANT
KIT BASIN OR (CUSTOM PROCEDURE TRAY) ×3 IMPLANT
KIT TURNOVER KIT B (KITS) ×3 IMPLANT
MILL MEDIUM DISP (BLADE) ×3 IMPLANT
NEEDLE HYPO 22GX1.5 SAFETY (NEEDLE) ×3 IMPLANT
NS IRRIG 1000ML POUR BTL (IV SOLUTION) ×3 IMPLANT
OIL CARTRIDGE MAESTRO DRILL (MISCELLANEOUS) ×3
PACK LAMINECTOMY NEURO (CUSTOM PROCEDURE TRAY) ×3 IMPLANT
ROD RADIUS 35MM (Rod) ×6 IMPLANT
SCREW 5.75X45MM (Screw) ×12 IMPLANT
SPONGE SURGIFOAM ABS GEL 100 (HEMOSTASIS) ×3 IMPLANT
STRIP CLOSURE SKIN 1/2X4 (GAUZE/BANDAGES/DRESSINGS) ×2 IMPLANT
SUT VIC AB 0 CT1 18XCR BRD8 (SUTURE) ×1 IMPLANT
SUT VIC AB 0 CT1 8-18 (SUTURE) ×2
SUT VIC AB 2-0 CT1 18 (SUTURE) ×3 IMPLANT
SUT VIC AB 3-0 SH 8-18 (SUTURE) ×9 IMPLANT
TOWEL GREEN STERILE (TOWEL DISPOSABLE) ×3 IMPLANT
TOWEL GREEN STERILE FF (TOWEL DISPOSABLE) ×3 IMPLANT
TRAY FOLEY MTR SLVR 16FR STAT (SET/KITS/TRAYS/PACK) ×3 IMPLANT
WATER STERILE IRR 1000ML POUR (IV SOLUTION) ×3 IMPLANT

## 2018-05-24 NOTE — Evaluation (Signed)
Physical Therapy Evaluation Patient Details Name: Natalie Pollard MRN: [id number] DOB: [birth year] Today's Date: 05/24/2018   History of Present Illness  73 year old female with a 60-month history of severe lumbar pain which is progressively worsened.  Patient with radiating pain into both lower extremities left greater than right.  Patient with progressive proximal left lower extremity weakness involving her quadriceps muscle group.  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 s/p Bilateral L4-5 decompressive laminotomies with bilateral L4-5 decompressive foraminotomies 05/24/18  Clinical Impression  Patient is s/p above surgery resulting in functional limitations due to the deficits listed below (see PT Problem List). Pt is limited in safe mobility by 10/10 pain,  bilateral LE weakness, and decreased balance. Pt requires mod A for bed mobility and transfers. Based on this evaluation pt requires SNF level rehab at d/c, due to her increased pain, however  it is quite possible that she will have increased mobility with decreased pain. PT will follow back in the morning to reassess mobility and perform stair training if possible so that pt can d/c home.        Follow Up Recommendations SNF    Equipment Recommendations  3in1 (PT)    Recommendations for Other Services OT consult     Precautions / Restrictions Precautions Precautions: Back Precaution Booklet Issued: No Precaution Comments: pt able to recall 3/3 precautions at end of session Required Braces or Orthoses: Spinal Brace Spinal Brace: Lumbar corset;Applied in sitting position Restrictions Weight Bearing Restrictions: No      Mobility  Bed Mobility Overal bed mobility: Needs Assistance Bed Mobility: Sidelying to Sit;Rolling Rolling: Min assist Sidelying to sit: Mod assist       General bed mobility comments: min A to help to roll fully onto her hip and  modA for bringing he trunk to upright as she slid her LE off the bed  Transfers Overall transfer level: Needs assistance Equipment used: Rolling walker (2 wheeled);None Transfers: Sit to/from [employer] Sit to Stand: Mod assist Stand pivot transfers: Mod assist       General transfer comment: first attempt required maxA for power up, pt unable to maintain due to bilateral knee buckling, with second attempt pt able to come up with modA and stand for 20 sec unable to perform stepping due to bilateral tremors in her LE, modA for stand step transfer with HHA from therapist and vc for stepping to recliner  Ambulation/Gait         Gait velocity: unable to attempt due to pain and weakness            Balance Overall balance assessment: Needs assistance Sitting-balance support: Feet supported;No upper extremity supported Sitting balance-Leahy Scale: Fair     Standing balance support: Bilateral upper extremity supported Standing balance-Leahy Scale: Poor Standing balance comment: requires B UE support and min A of therapist                             Pertinent Vitals/Pain Pain Assessment: 0-10 Pain Score: 10-Worst pain ever Pain Location: low back Pain Descriptors / Indicators: Operative site guarding;Sharp;Shooting Pain Intervention(s): Limited activity within patient's tolerance;Monitored during session;Repositioned;RN gave pain meds during session    Home Living Family/patient expects to be discharged to:: Private residence Living Arrangements: Children Available Help at Discharge: Available PRN/intermittently Type of Home: House Home Access: Stairs to enter Entrance Stairs-Rails: Right Entrance Stairs-Number of Steps: 4 Home Layout:  One level Home Equipment: Walker - 2 wheels;Toilet riser      Prior Function Level of Independence: Needs assistance   Gait / Transfers Assistance Needed: since September 2019 has required RW for household  ambulation  ADL's / Homemaking Assistance Needed: daughter assists with bathing and dressing           Extremity/Trunk Assessment   Upper Extremity Assessment Upper Extremity Assessment: Generalized weakness;Defer to OT evaluation    Lower Extremity Assessment Lower Extremity Assessment: RLE deficits/detail;LLE deficits/detail;Generalized weakness RLE Deficits / Details: hip flexion and knee extension strength grossly assessed as 3+/5 with functional mobility as pt experienced bilateral knee buckling with standing   RLE: Unable to fully assess due to pain RLE Coordination: decreased fine motor;decreased gross motor LLE Deficits / Details: hip flexion and knee extension strength grossly assessed as 3/5 with functional mobility as pt experienced bilateral knee buckling with standing   LLE: Unable to fully assess due to pain LLE Coordination: decreased fine motor;decreased gross motor    Cervical / Trunk Assessment Cervical / Trunk Assessment: Kyphotic  Communication   Communication: No difficulties  Cognition Arousal/Alertness: Awake/alert Behavior During Therapy: Flat affect;WFL for tasks assessed/performed Overall Cognitive Status: Within Functional Limits for tasks assessed                                        General Comments General comments (skin integrity, edema, etc.): Honeycomb dressing in place with no drainage noted, Pt on 1.5L O2 via [place] with SaO2 of 98%O2, supplemental O2 removed and pt able to maintain 96%O2 with mobility, left O2 off and notified RN        Assessment/Plan    PT Assessment Patient needs continued PT services  PT Problem List Decreased strength;Decreased activity tolerance;Decreased balance;Decreased mobility;Decreased coordination;Decreased safety awareness;Pain       PT Treatment Interventions DME instruction;Gait training;Stair training;Functional mobility training;Therapeutic activities;Therapeutic exercise;Balance  training;Cognitive remediation;Patient/family education    PT Goals (Current goals can be found in the Care Plan section)  Acute Rehab PT Goals Patient Stated Goal: go home PT Goal Formulation: With patient Time For Goal Achievement: 06/07/18 Potential to Achieve Goals: Fair    Frequency Min 5X/week    AM-PAC PT "6 Clicks" Mobility  Outcome Measure Help needed turning from your back to your side while in a flat bed without using bedrails?: A Lot Help needed moving from lying on your back to sitting on the side of a flat bed without using bedrails?: A Lot Help needed moving to and from a bed to a chair (including a wheelchair)?: A Lot Help needed standing up from a chair using your arms (e.g., wheelchair or bedside chair)?: A Lot Help needed to walk in hospital room?: Total Help needed climbing 3-5 steps with a railing? : Total 6 Click Score: 10    End of Session Equipment Utilized During Treatment: Back brace Activity Tolerance: Patient limited by pain Patient left: in chair;with call bell/phone within reach;with chair alarm set Nurse Communication: Patient requests pain meds;Mobility status;Precautions;Weight bearing status PT Visit Diagnosis: Unsteadiness on feet (R26.81);Other abnormalities of gait and mobility (R26.89);Muscle weakness (generalized) (M62.81);Difficulty in walking, not elsewhere classified (R26.2);Pain Pain - part of body: (low back )    Time: 1610-9604 PT Time Calculation (min) (ACUTE ONLY): 36 min   Charges:   PT Evaluation $PT Eval Low Complexity: 1 Low PT Treatments $Therapeutic Activity: 8-22 mins  Reason Helzer B. Beverely Risen PT, DPT Acute Rehabilitation Services Pager [phone] Office [phone]   Natalie Pollard Fleet 05/24/2018, 5:42 PM

## 2018-05-24 NOTE — Op Note (Signed)
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  Bilateral L4-5 ponte osteotomies for sagittal plane restoration  L4-5 posterior lumbar interbody fusion utilizing interbody cages and locally harvested autograft  L4-5 posterior lateral arthrodesis utilizing nonsegmental pedicle screw fixation and local autograft and allograft  Surgeon:Octavia Mottola A.Haskel Dewalt, M.D.  Asst. Surgeon: Doran Durand, NP  Anesthesia: General  Indication: 73 year old female with severe back and bilateral lower extremity symptoms worsening progressively over the past few months with worsening weakness and near complete inability to ambulate.  Work-up demonstrates evidence of marked disc degeneration with degenerative spondylolisthesis and severe stenosis at L4-5.  Patient presents now for decompression and fusion in hopes of improving her symptoms.  Operative note: After induction of anesthesia, patient position prone on the Wilson frame properly padded.  Lumbar region prepped and draped sterilely.  Incision overlying L4-5.  Dissection performed bilaterally.  Retractor placed.  Fluoroscopy used.  Levels confirmed.  Bilateral decompressive laminotomies and facetectomies then performed using Leksell rongeurs Kerrison under the high-speed drill to remove the inferior two thirds of the lamina of L4 the complete inferior facet of L4 bilaterally the majority the superior facet of L5 bilaterally and the superior aspect of the lamina of L5.  Ligament flavum elevated and resected.  Facetectomies were further completed to complete ponte osteotomies bilaterally for restoration of her sagittal balance.   Epidural venous plexus was coagulated and cut.  Bilateral discectomies then performed.  Dissipates then prepared for interbody fusion.  Soft tissue removed in the interspace.  To space was distracted with the patient distractor placed on the right side.  A 10 mm Medtronic standard expandable cage was packed with locally harvested autograft and then impacted in the place.  This is expanded to its full extent.  Distractor removed patient's right side.  The space prepared on the right side.  Morselized autograft packed in their space.  A second cage was impacted into place and expanded to its full extent.  Pedicles at L4 and L5 were identified using surface landmarks and intraoperative fluoroscopy of superficial bone overlying the pedicle was removed and then using high-speed drill.  Pedicle was then probed using a pedicle all each pedicle all track was probed and found to be solidly within the bone.  Each pedicle tract was then tapped with a screw tapped and once again probed to ensure good position.  5.75 mm radius brand screws from [employer] were placed bilaterally at L4 and L5.  Final images reveal good position of the hardware and the cages at the proper operative level with normal alignment spine.  Wound is irrigated one final time.  Transverse processes and residual facets were decorticated.  Morselized allograft packets were packed posterior laterally along with a small amount of autograft was packed posterior laterally at L4-5 for later fusion.  Short segment titanium rod placed of the screws at L4 and L5.  Locking caps placed over the screws.  Locking caps then engaged with a construct under compression.  Gelfoam was placed topically for hemostasis.  Vancomycin powder was placed in the deep wound space.  Wounds and closed in layers of Vicryl sutures.  Steri-Strips and sterile dressing were applied.  No apparent complications.  Patient tolerated procedure well and she returns to the recovery room  postop.

## 2018-05-24 NOTE — H&P (Signed)
Natalie Pollard is an 73 y.o. female.   Chief Complaint: Back pain and weakness HPI: 73 year old female with a 50-month history of severe lumbar pain which is progressively worsened.  Patient with radiating pain into both lower extremities left greater than right.  Patient with progressive proximal left lower extremity weakness involving her quadriceps muscle group.  Patient with difficulty standing and walking secondary to the severe pain.  Patient with progressive sensory loss.  No bowel or bladder dysfunction.  Patient found to have symptomatic grade 1 degenerative spondylolisthesis with stenosis at L4-5.  She presents now for decompression and fusion surgery.  Past Medical History:  Diagnosis Date  . Anxiety   . Chronic kidney disease    stage 3  . GERD (gastroesophageal reflux disease)   . High risk medication use   . Hypertension   . Insomnia   . Mixed hyperlipidemia     Past Surgical History:  Procedure Laterality Date  . OVARIAN CYST REMOVAL      History reviewed. No pertinent family history. Social History:  reports that she has been smoking. She has never used smokeless tobacco. She reports that she does not drink alcohol or use drugs.  Allergies:  Allergies  Allergen Reactions  . Azithromycin Other (See Comments)    Abdominal Pain  . Codeine Nausea And Vomiting    Medications Prior to Admission  Medication Sig Dispense Refill  . amLODipine (NORVASC) 10 MG tablet Take 10 mg by mouth daily.    . clonazePAM (KLONOPIN) 0.5 MG tablet Take 0.5 mg by mouth at bedtime.    . cloNIDine (CATAPRES) 0.1 MG tablet Take 0.05 mg by mouth 2 (two) times daily.    . cyclobenzaprine (FLEXERIL) 10 MG tablet Take 10 mg by mouth 3 (three) times daily as needed for muscle spasms.    . hydrochlorothiazide (MICROZIDE) 12.5 MG capsule Take 12.5 mg by mouth daily.    Marland Kitchen losartan (COZAAR) 100 MG tablet Take 100 mg by mouth daily.    . meloxicam (MOBIC) 7.5 MG tablet Take 7.5 mg by mouth 2  (two) times daily with a meal.    . oxyCODONE (OXY IR/ROXICODONE) 5 MG immediate release tablet Take 5 mg by mouth every 8 (eight) hours.    Marland Kitchen oxymetazoline (AFRIN) 0.05 % nasal spray Place 1 spray into both nostrils 2 (two) times daily as needed for congestion.      No results found for this or any previous visit (from the past 48 hour(s)). No results found.  Patient  Natalie Pollard 05/24/2018, 7:46 AM   Pertinent items noted in HPI and remainder of comprehensive ROS otherwise negative.  Blood pressure (!) 157/65, pulse 73, temperature 97.8 F (36.6 C), temperature source Oral, resp. rate 20, height 5\' 5"  (1.651 m), weight 82.1 kg, SpO2 98 %.  Patient is awake and alert.  She is oriented and appropriate.  She is obviously very uncomfortable.  Examination of her head ears eyes nose throat is unremarked.  Chest and abdomen are benign.  Extremities are free from injury or deformity.  Neurologically she is awake and alert.  Speech is fluent.  Judgment insight are intact.  Cranial nerve function normal bilateral.  Motor examination reveals weakness of her left-sided quadriceps muscle group grading at 4-/5.  She has some mild weakness of dorsiflexion on the left as well.  Sensor examination with decreased sensation pinprick light touch in her left L4-L5 and right L5 dermatomes.  Deep tendon reflexes are normal active step her  Achilles reflexes are absent bilaterally.  No evidence of long track signs.  Gait is very antalgic.  Posture is flexed. Assessment/Plan L4-L5 grade 1 degenerative spondylolisthesis with severe lateral recess and foraminal stenosis and ongoing severe radicular pain and weakness.  Plan L4-L5 bilateral decompressive laminotomy with foraminotomies followed by posterior lumbar interbody fusion utilizing interbody cages and locally harvested autograft with posterior arthrodesis utilizing nonsegmental pedicle screw fixation.  Risks and benefits of been explained.  Patient wishes to  proceed.   Nichols Corter A. Jordan Likes, MD

## 2018-05-24 NOTE — Anesthesia Procedure Notes (Signed)

## 2018-05-24 NOTE — Brief Op Note (Signed)
05/24/2018  9:47 AM  PATIENT:  Natalie Pollard  73 y.o. female  PRE-OPERATIVE DIAGNOSIS:  Spondylolisthesis  POST-OPERATIVE DIAGNOSIS:  Spondylolisthesis  PROCEDURE:  Procedure(s) with comments: Posterior Lumbar Interbody Fusion - Lumbar Four-Lumbar Five (N/A) - Posterior Lumbar Interbody Fusion - Lumbar Four-Lumbar Five   SURGEON:  Surgeon(s) and Role:    * Julio Sicks, MD - Primary  PHYSICIAN ASSISTANT:   ASSISTANTSDoran Durand, NP   ANESTHESIA:   general  EBL:  150 mL   BLOOD ADMINISTERED:none  DRAINS: none   LOCAL MEDICATIONS USED:  MARCAINE     SPECIMEN:  No Specimen  DISPOSITION OF SPECIMEN:  N/A  COUNTS:  YES  TOURNIQUET:  * No tourniquets in log *  DICTATION: .Dragon Dictation  PLAN OF CARE: Admit to inpatient   PATIENT DISPOSITION:  PACU - hemodynamically stable.   Delay start of Pharmacological VTE agent (>24hrs) due to surgical blood loss or risk of bleeding: yes

## 2018-05-24 NOTE — TOC Initial Note (Signed)
Transition of Care [facility]) - Initial/Assessment Note    Patient Details  Name: Natalie Pollard MRN: [id number] Date of Birth: [birth year]  Transition of Care [facility]) CM/SW Contact:    Glennon Mac, RN Phone Number: 05/24/2018, 4:12 PM  Clinical Narrative:  Pt admitted on 05/24/2018 s/p Bilateral L4-5 decompressive laminotomies with bilateral L4-5 decompressive foraminotomies.  PTA, pt resided at home with daughter, who provides 24h assistance.                  Expected Discharge Plan: Home w Home Health Services Barriers to Discharge: Continued Medical Work up   Patient Goals and CMS Choice        Expected Discharge Plan and Services Expected Discharge Plan: Home w Home Health Services   Discharge Planning Services: CM Consult   Living arrangements for the past 2 months: Single Family Home                          Prior Living Arrangements/Services Living arrangements for the past 2 months: Single Family Home Lives with:: Adult Children Patient language and need for interpreter reviewed:: No Do you feel safe going back to the place where you live?: Yes      Need for Family Participation in Patient Care: No (Comment) Care giver support system in place?: Yes (comment)(Daughter/ 24h a day since September) Current home services: DME(Has RW and 3 in 1 at home) Criminal Activity/Legal Involvement Pertinent to Current Situation/Hospitalization: No - Comment as needed  Activities of Daily Living Home Assistive Devices/Equipment: Environmental consultant (specify type), Wheelchair ADL Screening (condition at time of admission) Patient's cognitive ability adequate to safely complete daily activities?: Yes Is the patient deaf or have difficulty hearing?: No Does the patient have difficulty seeing, even when wearing glasses/contacts?: No Does the patient have difficulty concentrating, remembering, or making decisions?: No Patient able to express need for assistance with ADLs?: Yes Does the  patient have difficulty dressing or bathing?: No Independently performs ADLs?: Yes (appropriate for developmental age) Does the patient have difficulty walking or climbing stairs?: Yes Weakness of Legs: Both Weakness of Arms/Hands: Both  Permission Sought/Granted Permission sought to share information with : Case Manager                Emotional Assessment Appearance:: Appears stated age   Affect (typically observed): Accepting, Appropriate Orientation: : Oriented to Self, Oriented to Place, Oriented to  Time, Oriented to Situation Alcohol / Substance Use: Not Applicable Psych Involvement: No (comment)  Admission diagnosis:  Spondylolisthesis Patient Active Problem List   Diagnosis Date Noted  . Degenerative spondylolisthesis 05/24/2018   PCP:  Krystal Clark, NP Pharmacy:   [id number] - [place],  - [address] AT [address] [place] [address] Phone: [phone] Fax: [phone]     Social Determinants of Health (SDOH) Interventions    Readmission Risk Interventions No flowsheet data found.   Quintella Baton, RN, BSN  Trauma/Neuro ICU Case Manager [phone]

## 2018-05-24 NOTE — Transfer of Care (Signed)
Immediate Anesthesia Transfer of Care Note  Patient: Natalie Pollard  Procedure(s) Performed: Posterior Lumbar Interbody Fusion - Lumbar Four-Lumbar Five (N/A Back)  Patient Location: PACU  Anesthesia Type:General  Level of Consciousness: awake, alert  and oriented  Airway & Oxygen Therapy: Patient Spontanous Breathing and Patient connected to face mask oxygen  Post-op Assessment: Report given to RN and Post -op Vital signs reviewed and stable  Post vital signs: Reviewed and stable  Last Vitals:  Vitals Value Taken Time  BP    Temp    Pulse 64 05/24/2018  9:59 AM  Resp 15 05/24/2018  9:59 AM  SpO2 96 % 05/24/2018  9:59 AM  Vitals shown include unvalidated device data.  Last Pain:  Vitals:   05/24/18 0601  TempSrc: Oral  PainSc: 10-Worst pain ever      Patients Stated Pain Goal: 5 (05/24/18 0601)  Complications: No apparent anesthesia complications

## 2018-05-24 NOTE — Progress Notes (Signed)
Orthopedic Tech Progress Note Patient Details:  Natalie Pollard 1945-09-02 [id number] Just got off the phone with [facility] and they are bringing this patient brace shortly Patient ID: Natalie Pollard, female   DOB: 09-Mar-1945, 73 y.o.   MRN: [id number]   Natalie Pollard 05/24/2018, 10:35 AM

## 2018-05-24 NOTE — Progress Notes (Signed)
Called and notified pt daughter Misty Stanley [phone] to report move from PACU to 4NP room 2

## 2018-05-25 MED ORDER — OXYCODONE HCL 10 MG PO TABS: 10.0000 mg | ORAL_TABLET | ORAL | 0 refills | Status: AC | PRN

## 2018-05-25 MED ORDER — CYCLOBENZAPRINE HCL 10 MG PO TABS: 10.0000 mg | ORAL_TABLET | Freq: Three times a day (TID) | ORAL | Status: DC | PRN

## 2018-05-25 NOTE — Discharge Summary (Signed)
Physician Discharge Summary  Patient ID: Natalie Pollard MRN: [id number] DOB/AGE: May 08, 1945 73 y.o.  Admit date: 05/24/2018 Discharge date: 05/25/2018  Admission Diagnoses: Lumbar spondylolisthesis, lumbar spinal stenosis, lumbago, lumbar radiculopathy  Discharge Diagnoses: The same Active Problems:   Degenerative spondylolisthesis   Discharged Condition: good  Hospital Course: Dr. Jordan Likes performed an L4-5 decompression, instrumentation and fusion on patient on 05/24/2018.    The patient's postoperative course was unremarkable.  On postoperative day #1 she requested discharge home.  She was given written and oral discharge instructions.  All her questions were answered.  Consults: None Significant Diagnostic Studies: None Treatments: L4-5 decompression, instrumentation and fusion Discharge Exam: Blood pressure (!) 159/66, pulse 87, temperature 98 F (36.7 C), temperature source Oral, resp. rate 18, height 5\' 5"  (1.651 m), weight 82.1 kg, SpO2 99 %. The patient is alert and pleasant.  She looks well.  Her strength is normal.  Disposition: Home   Allergies as of 05/25/2018      Reactions   Azithromycin Other (See Comments)   Abdominal Pain   Codeine Nausea And Vomiting      Medication List    STOP taking these medications   cyclobenzaprine 10 MG tablet Commonly known as:  FLEXERIL   meloxicam 7.5 MG tablet Commonly known as:  MOBIC     TAKE these medications   amLODipine 10 MG tablet Commonly known as:  NORVASC Take 10 mg by mouth daily.   clonazePAM 0.5 MG tablet Commonly known as:  KLONOPIN Take 0.5 mg by mouth at bedtime.   cloNIDine 0.1 MG tablet Commonly known as:  CATAPRES Take 0.05 mg by mouth 2 (two) times daily.   hydrochlorothiazide 12.5 MG capsule Commonly known as:  MICROZIDE Take 12.5 mg by mouth daily.   losartan 100 MG tablet Commonly known as:  COZAAR Take 100 mg by mouth daily.   Oxycodone HCl 10 MG Tabs Take 1 tablet (10 mg total)  by mouth every 4 (four) hours as needed for severe pain ((score 7 to 10)). What changed:    medication strength  how much to take  when to take this  reasons to take this   oxymetazoline 0.05 % nasal spray Commonly known as:  AFRIN Place 1 spray into both nostrils 2 (two) times daily as needed for congestion.            Durable Medical Equipment  (From admission, onward)         Start     Ordered   05/24/18 1112  DME Walker rolling  Once    Question:  Patient needs a walker to treat with the following condition  Answer:  Degenerative spondylolisthesis   05/24/18 1111   05/24/18 1112  DME 3 n 1  Once     05/24/18 1111           Signed: Cristi Loron 05/25/2018, 10:33 AM

## 2018-05-25 NOTE — Evaluation (Signed)
Occupational Therapy Evaluation Patient Details Name: Natalie PayorLinda B Pollard MRN: [id number] DOB: [birth year] Today's Date: 05/25/2018    History of Present Illness 73 year old female with a 3436-month history of severe lumbar pain which is progressively worsened.  Patient with radiating pain into both lower extremities left greater than right.  Patient with progressive proximal left lower extremity weakness involving her quadriceps muscle group.  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 s/p Bilateral L4-5 decompressive laminotomies with bilateral L4-5 decompressive foraminotomies 05/24/18   Clinical Impression   PTA, pt with decline in function since September and her daughter has been living with her and assisting with ADLs. Pt requiring supervision-Mod A for UB ADLs, Min-Mod A for LB ADLs, and Min Guard-Min A for functional transfers. Pt presenting with poor balance and cognition impacting her safety and performance of ADLs. Educating pt on back precautions, LB ADLs with AE, bed mobility, brace management, and functional transfers. Recommended pt sponge bath at sink due to high fall risk. Pt would benefit from further acute OT to facilitate safe dc. Pt planning for dc home today and will require 24/7 supervision and HHOT for further OT to optimize safety, independence with ADLs, and return to PLOF.      Follow Up Recommendations  SNF;Home health OT;Supervision/Assistance - 24 hour(Pt planning for dc home and requires HHOT)    Equipment Recommendations  None recommended by OT    Recommendations for Other Services PT consult     Precautions / Restrictions Precautions Precautions: Back Precaution Booklet Issued: No Precaution Comments: pt with poor recall of precautions and required max cues to recall no twisting or lifting Required Braces or Orthoses: Spinal Brace Spinal Brace: Lumbar corset;Applied in sitting  position Restrictions Weight Bearing Restrictions: No      Mobility Bed Mobility               General bed mobility comments: In recliner upon arrival  Transfers Overall transfer level: Needs assistance Equipment used: Rolling walker (2 wheeled);None Transfers: Sit to/from Stand Sit to Stand: Min guard;Min assist         General transfer comment: Min Guard A for safety. Min-Mod A for standing balance during ADLs. Posterior lean that pt is unaware of. Pt requiring Max cues for hand placement    Balance Overall balance assessment: Needs assistance Sitting-balance support: Feet supported;No upper extremity supported Sitting balance-Leahy Scale: Fair     Standing balance support: Bilateral upper extremity supported Standing balance-Leahy Scale: Poor Standing balance comment: requires B UE support and min A of therapist                           ADL either performed or assessed with clinical judgement   ADL Overall ADL's : Needs assistance/impaired Eating/Feeding: Independent;Sitting   Grooming: Set up;Supervision/safety;Sitting   Upper Body Bathing: Set up;Supervision/ safety;Sitting   Lower Body Bathing: Minimal assistance;Moderate assistance;Sit to/from stand;With adaptive equipment   Upper Body Dressing : Set up;Supervision/safety;Sitting;Moderate assistance Upper Body Dressing Details (indicate cue type and reason): Donned shirt with supervision. Mod A for donning brace Lower Body Dressing: Minimal assistance;Moderate assistance;Sit to/from stand;Cueing for safety;Cueing for sequencing Lower Body Dressing Details (indicate cue type and reason): Educating pt on Min A for management of AE. Min A foir standing balance. Donned pants and underwear Toilet Transfer: Minimal assistance;Min guard;Ambulation;RW(simulated to recliner)   Toileting- Clothing Manipulation and Hygiene: Minimal assistance;Sit to/from stand Toileting - ArchitectClothing Manipulation Details  (  indicate cue type and reason): Min A for stanidng balance. Educating pt on AE for toilet hygiene   Tub/Shower Transfer Details (indicate cue type and reason): Recommending pt sponge bath at sink due to poor balance and safety Functional mobility during ADLs: Minimal assistance;Min guard;Rolling walker General ADL Comments: Pt preesenting with poor balance, cognition, and strength. Poor adherance to precautions.      Vision Baseline Vision/History: Wears glasses Wears Glasses: Reading only Patient Visual Report: No change from baseline       Perception     Praxis      Pertinent Vitals/Pain Pain Assessment: Faces Faces Pain Scale: Hurts little more Pain Location: low back Pain Descriptors / Indicators: Operative site guarding;Sharp;Shooting Pain Intervention(s): Monitored during session;Limited activity within patient's tolerance;Repositioned     Hand Dominance Right   Extremity/Trunk Assessment Upper Extremity Assessment Upper Extremity Assessment: Generalized weakness   Lower Extremity Assessment Lower Extremity Assessment: Defer to PT evaluation RLE Deficits / Details: hip flexion and knee extension strength grossly assessed as 3+/5 with functional mobility as pt experienced bilateral knee buckling with standing   RLE: Unable to fully assess due to pain RLE Coordination: decreased fine motor;decreased gross motor LLE Deficits / Details: hip flexion and knee extension strength grossly assessed as 3/5 with functional mobility as pt experienced bilateral knee buckling with standing   LLE: Unable to fully assess due to pain LLE Coordination: decreased fine motor;decreased gross motor   Cervical / Trunk Assessment Cervical / Trunk Assessment: Kyphotic;Other exceptions Cervical / Trunk Exceptions: s/p back sx   Communication Communication Communication: No difficulties   Cognition Arousal/Alertness: Awake/alert Behavior During Therapy: Flat affect Overall Cognitive  Status: Impaired/Different from baseline Area of Impairment: Memory;Following commands;Safety/judgement;Awareness;Problem solving                     Memory: Decreased short-term memory;Decreased recall of precautions Following Commands: Follows one step commands inconsistently;Follows one step commands with increased time Safety/Judgement: Decreased awareness of safety;Decreased awareness of deficits Awareness: Emergent Problem Solving: Slow processing;Requires verbal cues;Difficulty sequencing General Comments: Pt with poor adhereance and recall of precautions. perseverating on topic of getting dressed to go home. Poor attention and problem solving and requiring increased cues.   General Comments  Reporting dizziness. BP stable    Exercises     Shoulder Instructions      Home Living Family/patient expects to be discharged to:: Private residence Living Arrangements: Children Available Help at Discharge: Available PRN/intermittently Type of Home: House Home Access: Stairs to enter [employer] of Steps: 4 Entrance Stairs-Rails: Right Home Layout: One level     Bathroom Shower/Tub: Chief Strategy Officer: Standard     Home Equipment: Environmental consultant - 2 wheels;Toilet riser;Shower seat          Prior Functioning/Environment Level of Independence: Needs assistance  Gait / Transfers Assistance Needed: since September 2019 has required RW for household ambulation ADL's / Homemaking Assistance Needed: daughter assists with bathing and dressing since decline   Comments: Independent prior to sept        OT Problem List: Decreased strength;Decreased range of motion;Decreased activity tolerance;Impaired balance (sitting and/or standing);Decreased knowledge of use of DME or AE;Decreased knowledge of precautions;Pain;Decreased cognition      OT Treatment/Interventions: Therapeutic exercise;Self-care/ADL training;Energy conservation;DME and/or AE  instruction;Therapeutic activities;Patient/family education    OT Goals(Current goals can be found in the care plan section) Acute Rehab OT Goals Patient Stated Goal: "Go home today" OT Goal Formulation: With patient Time For  Goal Achievement: 06/08/18 Potential to Achieve Goals: Good  OT Frequency: Min 3X/week   Barriers to D/C:            Co-evaluation              AM-PAC OT "6 Clicks" Daily Activity     Outcome Measure Help from another person eating meals?: None Help from another person taking care of personal grooming?: A Little Help from another person toileting, which includes using toliet, bedpan, or urinal?: A Little Help from another person bathing (including washing, rinsing, drying)?: A Lot Help from another person to put on and taking off regular upper body clothing?: A Lot Help from another person to put on and taking off regular lower body clothing?: A Lot 6 Click Score: 16   End of Session Equipment Utilized During Treatment: Rolling walker;Back brace Nurse Communication: Mobility status  Activity Tolerance: Patient tolerated treatment well Patient left: in chair;with call bell/phone within reach  OT Visit Diagnosis: Unsteadiness on feet (R26.81);Other abnormalities of gait and mobility (R26.89);Muscle weakness (generalized) (M62.81);Other symptoms and signs involving cognitive function;Pain Pain - part of body: (Back)                Time: 4917-9150 OT Time Calculation (min): 28 min Charges:  OT General Charges $OT Visit: 1 Visit OT Evaluation $OT Eval Moderate Complexity: 1 Mod OT Treatments $Self Care/Home Management : 8-22 mins  Afsana Liera MSOT, OTR/L Acute Rehab Pager: [phone] Office: [phone]  Theodoro Grist Daron Breeding 05/25/2018, 11:10 AM

## 2018-05-25 NOTE — TOC Transition Note (Signed)
Transition of Care [facility]) - CM/SW Discharge Note   Patient Details  Name: SANDRINE OSBERG MRN: [id number] Date of Birth: [birth year]  Transition [facility]) CM/SW Contact:  Deveron Furlong, RN Phone Number: 05/25/2018, 12:20 PM       Final next level of care: Home w Home Health Services Barriers to Discharge: Barriers Resolved   Patient Goals and CMS Choice Patient states their goals for this hospitalization and ongoing recovery are:: to go home CMS Medicare.gov Compare Post Acute Care list provided to:: Patient Choice offered to / list presented to : Patient, Adult Children       Discharge Plan and Services   Discharge Planning Services: CM Consult Post Acute Care Choice: Home Health(Pt has necessary DME including RW and 3n1.  Declines rollator)              HH Arranged: PT HH Agency: [facility]  Pt is active with [facility].  Daughter states she is instructed to call when patient arrives home.  Orders faxed to [facility] [phone]).

## 2018-05-25 NOTE — Anesthesia Postprocedure Evaluation (Signed)
Anesthesia Post Note  Patient: Natalie Pollard  Procedure(s) Performed: Posterior Lumbar Interbody Fusion - Lumbar Four-Lumbar Five (N/A Back)     Patient location during evaluation: PACU Anesthesia Type: General Level of consciousness: awake and alert Pain management: pain level controlled Vital Signs Assessment: post-procedure vital signs reviewed and stable Respiratory status: spontaneous breathing, nonlabored ventilation and respiratory function stable Cardiovascular status: blood pressure returned to baseline and stable Postop Assessment: no apparent nausea or vomiting Anesthetic complications: no    Last Vitals:  Vitals:   05/25/18 0345 05/25/18 0757  BP: 124/61 (!) 159/66  Pulse: 66 87  Resp: 18 18  Temp: 37.1 C 36.7 C  SpO2: 98% 99%     Lucretia Kern

## 2018-05-25 NOTE — Progress Notes (Signed)
Physical Therapy Treatment Patient Details Name: Natalie Pollard MRN: [id number] DOB: 1945/04/28 Today's Date: 05/25/2018    History of Present Illness 73 year old female with a 61-month history of severe lumbar pain which is progressively worsened.  Patient with radiating pain into both lower extremities left greater than right.  Patient with progressive proximal left lower extremity weakness involving her quadriceps muscle group.  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 s/p Bilateral L4-5 decompressive laminotomies with bilateral L4-5 decompressive foraminotomies 05/24/18    PT Comments    Pt still showing confusion, needing repetitive cues for safety.  Emphasis on reinforcing education, sit to stand, gait and stair training.  Pt can benefit from follow up.    Follow Up Recommendations  Home health PT     Equipment Recommendations  3in1 (PT)    Recommendations for Other Services       Precautions / Restrictions Precautions Precautions: Back Precaution Booklet Issued: No Precaution Comments: pt able to recall 3/3 precautions at end of session Required Braces or Orthoses: Spinal Brace Spinal Brace: Lumbar corset;Applied in sitting position Restrictions Weight Bearing Restrictions: No    Mobility  Bed Mobility               General bed mobility comments: discussed and demonstrated.  Transfers Overall transfer level: Needs assistance Equipment used: Rolling walker (2 wheeled) Transfers: Sit to/from Stand Sit to Stand: Min guard;Min assist(depending on surface height)         General transfer comment: cues for hand placement  Ambulation/Gait Ambulation/Gait assistance: Min guard Gait Distance (Feet): 150 Feet Assistive device: Rolling walker (2 wheeled) Gait Pattern/deviations: Step-through pattern Gait velocity: slower   General Gait Details: short, slow guarded steps.  Moderate use of  the RW, cues for posture.   Stairs Stairs: Yes Stairs assistance: Min assist Stair Management: One rail Right;Alternating pattern;Step to pattern;Forwards Number of Stairs: 3 General stair comments: needed repetitive cues for correct sequencing.  Needs assist and cues.   Wheelchair Mobility    Modified Rankin (Stroke Patients Only)       Balance Overall balance assessment: Needs assistance Sitting-balance support: Feet supported;No upper extremity supported Sitting balance-Leahy Scale: Fair     Standing balance support: Bilateral upper extremity supported Standing balance-Leahy Scale: Poor Standing balance comment: reliant on AD or external support                            Cognition Arousal/Alertness: Awake/alert Behavior During Therapy: Flat affect Overall Cognitive Status: Impaired/Different from baseline Area of Impairment: Memory;Following commands;Safety/judgement;Awareness;Problem solving                     Memory: Decreased short-term memory;Decreased recall of precautions Following Commands: Follows one step commands inconsistently;Follows one step commands with increased time Safety/Judgement: Decreased awareness of safety;Decreased awareness of deficits Awareness: Emergent Problem Solving: Slow processing;Requires verbal cues;Difficulty sequencing General Comments: Very distractible and tangential      Exercises      General Comments General comments (skin integrity, edema, etc.): Reinforced all education      Pertinent Vitals/Pain Pain Assessment: Faces Faces Pain Scale: Hurts little more Pain Location: low back Pain Descriptors / Indicators: Operative site guarding;Sharp;Shooting Pain Intervention(s): Monitored during session    Home Living Family/patient expects to be discharged to:: Private residence Living Arrangements: Children Available Help at Discharge: Available PRN/intermittently Type of Home: House Home Access:  Stairs to enter Entrance  Stairs-Rails: Right Home Layout: One level Home Equipment: Walker - 2 wheels;Toilet riser;Shower seat      Prior Function Level of Independence: Needs assistance  Gait / Transfers Assistance Needed: since September 2019 has required RW for household ambulation ADL's / Homemaking Assistance Needed: daughter assists with bathing and dressing since decline Comments: Independent prior to sept   PT Goals (current goals can now be found in the care plan section) Acute Rehab PT Goals Patient Stated Goal: "Go home today" PT Goal Formulation: With patient Time For Goal Achievement: 06/07/18 Potential to Achieve Goals: Fair Progress towards PT goals: Progressing toward goals    Frequency    Min 5X/week      PT Plan Current plan remains appropriate    Co-evaluation              AM-PAC PT "6 Clicks" Mobility   Outcome Measure  Help needed turning from your back to your side while in a flat bed without using bedrails?: A Lot Help needed moving from lying on your back to sitting on the side of a flat bed without using bedrails?: A Lot Help needed moving to and from a bed to a chair (including a wheelchair)?: A Little Help needed standing up from a chair using your arms (e.g., wheelchair or bedside chair)?: A Little Help needed to walk in hospital room?: A Little Help needed climbing 3-5 steps with a railing? : A Little 6 Click Score: 16    End of Session Equipment Utilized During Treatment: Back brace Activity Tolerance: Patient limited by pain Patient left: in chair;with call bell/phone within reach;with chair alarm set Nurse Communication: Mobility status;Precautions PT Visit Diagnosis: Other abnormalities of gait and mobility (R26.89);Pain Pain - part of body: (back)     Time: 6283-1517 PT Time Calculation (min) (ACUTE ONLY): 20 min  Charges:  $Gait Training: 8-22 mins                     05/25/2018  [place] Bing, PT Acute Rehabilitation  Services [phone]  (pager) [phone]  (office)   Eliseo Gum Shela Esses 05/25/2018, 11:53 AM

## 2018-05-25 NOTE — Progress Notes (Signed)
IV removed. D/C education given to pt, questions answered. No printed prescriptions or equipment to be delivered. Pt taken to car with all belongings.

## 2018-05-28 DIAGNOSIS — Z79891 Long term (current) use of opiate analgesic: Secondary | ICD-10-CM | POA: Diagnosis not present

## 2018-05-28 DIAGNOSIS — F17210 Nicotine dependence, cigarettes, uncomplicated: Secondary | ICD-10-CM | POA: Diagnosis not present

## 2018-05-28 DIAGNOSIS — E782 Mixed hyperlipidemia: Secondary | ICD-10-CM | POA: Diagnosis not present

## 2018-05-28 DIAGNOSIS — Z9181 History of falling: Secondary | ICD-10-CM | POA: Diagnosis not present

## 2018-05-28 DIAGNOSIS — N183 Chronic kidney disease, stage 3 (moderate): Secondary | ICD-10-CM | POA: Diagnosis not present

## 2018-05-28 DIAGNOSIS — Z7902 Long term (current) use of antithrombotics/antiplatelets: Secondary | ICD-10-CM | POA: Diagnosis not present

## 2018-05-28 DIAGNOSIS — G4700 Insomnia, unspecified: Secondary | ICD-10-CM | POA: Diagnosis not present

## 2018-05-28 DIAGNOSIS — I700 Atherosclerosis of aorta: Secondary | ICD-10-CM | POA: Diagnosis not present

## 2018-05-28 DIAGNOSIS — M48061 Spinal stenosis, lumbar region without neurogenic claudication: Secondary | ICD-10-CM | POA: Diagnosis not present

## 2018-05-28 DIAGNOSIS — M4316 Spondylolisthesis, lumbar region: Secondary | ICD-10-CM | POA: Diagnosis not present

## 2018-05-28 DIAGNOSIS — M5136 Other intervertebral disc degeneration, lumbar region: Secondary | ICD-10-CM | POA: Diagnosis not present

## 2018-05-28 DIAGNOSIS — I129 Hypertensive chronic kidney disease with stage 1 through stage 4 chronic kidney disease, or unspecified chronic kidney disease: Secondary | ICD-10-CM | POA: Diagnosis not present

## 2018-05-28 DIAGNOSIS — Z4789 Encounter for other orthopedic aftercare: Secondary | ICD-10-CM | POA: Diagnosis not present

## 2018-05-28 MED FILL — Heparin Sodium (Porcine) Inj 1000 Unit/ML: INTRAMUSCULAR | Qty: 30 | Status: AC

## 2018-05-28 MED FILL — Sodium Chloride IV Soln 0.9%: INTRAVENOUS | Qty: 1000 | Status: AC

## 2018-06-27 DIAGNOSIS — I700 Atherosclerosis of aorta: Secondary | ICD-10-CM | POA: Diagnosis not present

## 2018-06-27 DIAGNOSIS — F17210 Nicotine dependence, cigarettes, uncomplicated: Secondary | ICD-10-CM | POA: Diagnosis not present

## 2018-06-27 DIAGNOSIS — Z79891 Long term (current) use of opiate analgesic: Secondary | ICD-10-CM | POA: Diagnosis not present

## 2018-06-27 DIAGNOSIS — G4700 Insomnia, unspecified: Secondary | ICD-10-CM | POA: Diagnosis not present

## 2018-06-27 DIAGNOSIS — Z4789 Encounter for other orthopedic aftercare: Secondary | ICD-10-CM | POA: Diagnosis not present

## 2018-06-27 DIAGNOSIS — E782 Mixed hyperlipidemia: Secondary | ICD-10-CM | POA: Diagnosis not present

## 2018-06-27 DIAGNOSIS — M4316 Spondylolisthesis, lumbar region: Secondary | ICD-10-CM | POA: Diagnosis not present

## 2018-06-27 DIAGNOSIS — M5136 Other intervertebral disc degeneration, lumbar region: Secondary | ICD-10-CM | POA: Diagnosis not present

## 2018-06-27 DIAGNOSIS — I129 Hypertensive chronic kidney disease with stage 1 through stage 4 chronic kidney disease, or unspecified chronic kidney disease: Secondary | ICD-10-CM | POA: Diagnosis not present

## 2018-06-27 DIAGNOSIS — Z7902 Long term (current) use of antithrombotics/antiplatelets: Secondary | ICD-10-CM | POA: Diagnosis not present

## 2018-06-27 DIAGNOSIS — M48061 Spinal stenosis, lumbar region without neurogenic claudication: Secondary | ICD-10-CM | POA: Diagnosis not present

## 2018-06-27 DIAGNOSIS — Z9181 History of falling: Secondary | ICD-10-CM | POA: Diagnosis not present

## 2018-06-27 DIAGNOSIS — N183 Chronic kidney disease, stage 3 (moderate): Secondary | ICD-10-CM | POA: Diagnosis not present

## 2018-07-03 DIAGNOSIS — M4310 Spondylolisthesis, site unspecified: Secondary | ICD-10-CM | POA: Diagnosis not present

## 2018-07-25 DIAGNOSIS — R5381 Other malaise: Secondary | ICD-10-CM | POA: Diagnosis not present

## 2018-07-25 DIAGNOSIS — N183 Chronic kidney disease, stage 3 (moderate): Secondary | ICD-10-CM | POA: Diagnosis not present

## 2018-07-25 DIAGNOSIS — I129 Hypertensive chronic kidney disease with stage 1 through stage 4 chronic kidney disease, or unspecified chronic kidney disease: Secondary | ICD-10-CM | POA: Diagnosis not present

## 2018-07-25 DIAGNOSIS — E782 Mixed hyperlipidemia: Secondary | ICD-10-CM | POA: Diagnosis not present

## 2018-07-25 DIAGNOSIS — K219 Gastro-esophageal reflux disease without esophagitis: Secondary | ICD-10-CM | POA: Diagnosis not present

## 2018-07-25 DIAGNOSIS — R5383 Other fatigue: Secondary | ICD-10-CM | POA: Diagnosis not present

## 2018-07-25 DIAGNOSIS — F419 Anxiety disorder, unspecified: Secondary | ICD-10-CM | POA: Diagnosis not present

## 2018-07-25 DIAGNOSIS — F5101 Primary insomnia: Secondary | ICD-10-CM | POA: Diagnosis not present

## 2018-07-25 DIAGNOSIS — M5136 Other intervertebral disc degeneration, lumbar region: Secondary | ICD-10-CM | POA: Diagnosis not present

## 2018-08-27 DIAGNOSIS — M4310 Spondylolisthesis, site unspecified: Secondary | ICD-10-CM | POA: Diagnosis not present

## 2018-08-27 DIAGNOSIS — R293 Abnormal posture: Secondary | ICD-10-CM | POA: Diagnosis not present

## 2018-08-27 DIAGNOSIS — M79605 Pain in left leg: Secondary | ICD-10-CM | POA: Diagnosis not present

## 2018-08-27 DIAGNOSIS — M79604 Pain in right leg: Secondary | ICD-10-CM | POA: Diagnosis not present

## 2018-08-27 DIAGNOSIS — R2689 Other abnormalities of gait and mobility: Secondary | ICD-10-CM | POA: Diagnosis not present

## 2018-08-29 DIAGNOSIS — M4310 Spondylolisthesis, site unspecified: Secondary | ICD-10-CM | POA: Diagnosis not present

## 2018-08-29 DIAGNOSIS — R293 Abnormal posture: Secondary | ICD-10-CM | POA: Diagnosis not present

## 2018-08-29 DIAGNOSIS — R2689 Other abnormalities of gait and mobility: Secondary | ICD-10-CM | POA: Diagnosis not present

## 2018-08-29 DIAGNOSIS — M79604 Pain in right leg: Secondary | ICD-10-CM | POA: Diagnosis not present

## 2018-08-29 DIAGNOSIS — M79605 Pain in left leg: Secondary | ICD-10-CM | POA: Diagnosis not present

## 2018-09-11 DIAGNOSIS — Z6830 Body mass index (BMI) 30.0-30.9, adult: Secondary | ICD-10-CM | POA: Diagnosis not present

## 2018-09-11 DIAGNOSIS — M4310 Spondylolisthesis, site unspecified: Secondary | ICD-10-CM | POA: Diagnosis not present

## 2018-09-11 DIAGNOSIS — I10 Essential (primary) hypertension: Secondary | ICD-10-CM | POA: Diagnosis not present

## 2018-10-01 DIAGNOSIS — M4310 Spondylolisthesis, site unspecified: Secondary | ICD-10-CM | POA: Diagnosis not present

## 2018-10-01 DIAGNOSIS — R293 Abnormal posture: Secondary | ICD-10-CM | POA: Diagnosis not present

## 2018-10-01 DIAGNOSIS — M79605 Pain in left leg: Secondary | ICD-10-CM | POA: Diagnosis not present

## 2018-10-01 DIAGNOSIS — R2689 Other abnormalities of gait and mobility: Secondary | ICD-10-CM | POA: Diagnosis not present

## 2018-10-01 DIAGNOSIS — M79604 Pain in right leg: Secondary | ICD-10-CM | POA: Diagnosis not present

## 2018-10-23 DIAGNOSIS — Z6828 Body mass index (BMI) 28.0-28.9, adult: Secondary | ICD-10-CM | POA: Diagnosis not present

## 2018-10-23 DIAGNOSIS — I10 Essential (primary) hypertension: Secondary | ICD-10-CM | POA: Diagnosis not present

## 2018-10-23 DIAGNOSIS — M4310 Spondylolisthesis, site unspecified: Secondary | ICD-10-CM | POA: Diagnosis not present

## 2018-10-28 DIAGNOSIS — I129 Hypertensive chronic kidney disease with stage 1 through stage 4 chronic kidney disease, or unspecified chronic kidney disease: Secondary | ICD-10-CM | POA: Diagnosis not present

## 2018-10-28 DIAGNOSIS — M5136 Other intervertebral disc degeneration, lumbar region: Secondary | ICD-10-CM | POA: Diagnosis not present

## 2018-10-28 DIAGNOSIS — K219 Gastro-esophageal reflux disease without esophagitis: Secondary | ICD-10-CM | POA: Diagnosis not present

## 2018-10-28 DIAGNOSIS — Z720 Tobacco use: Secondary | ICD-10-CM | POA: Diagnosis not present

## 2018-10-28 DIAGNOSIS — F5101 Primary insomnia: Secondary | ICD-10-CM | POA: Diagnosis not present

## 2018-10-28 DIAGNOSIS — Z23 Encounter for immunization: Secondary | ICD-10-CM | POA: Diagnosis not present

## 2018-10-28 DIAGNOSIS — Z789 Other specified health status: Secondary | ICD-10-CM | POA: Diagnosis not present

## 2018-10-28 DIAGNOSIS — E782 Mixed hyperlipidemia: Secondary | ICD-10-CM | POA: Diagnosis not present

## 2018-10-28 DIAGNOSIS — R5381 Other malaise: Secondary | ICD-10-CM | POA: Diagnosis not present

## 2018-10-28 DIAGNOSIS — N183 Chronic kidney disease, stage 3 (moderate): Secondary | ICD-10-CM | POA: Diagnosis not present

## 2018-10-28 DIAGNOSIS — R5383 Other fatigue: Secondary | ICD-10-CM | POA: Diagnosis not present

## 2018-10-28 DIAGNOSIS — F419 Anxiety disorder, unspecified: Secondary | ICD-10-CM | POA: Diagnosis not present

## 2018-10-28 DIAGNOSIS — Z79899 Other long term (current) drug therapy: Secondary | ICD-10-CM | POA: Diagnosis not present

## 2018-10-29 DIAGNOSIS — M79604 Pain in right leg: Secondary | ICD-10-CM | POA: Diagnosis not present

## 2018-10-29 DIAGNOSIS — R293 Abnormal posture: Secondary | ICD-10-CM | POA: Diagnosis not present

## 2018-10-29 DIAGNOSIS — M4310 Spondylolisthesis, site unspecified: Secondary | ICD-10-CM | POA: Diagnosis not present

## 2018-10-29 DIAGNOSIS — R2689 Other abnormalities of gait and mobility: Secondary | ICD-10-CM | POA: Diagnosis not present

## 2018-10-29 DIAGNOSIS — M79605 Pain in left leg: Secondary | ICD-10-CM | POA: Diagnosis not present

## 2018-12-04 DIAGNOSIS — Z981 Arthrodesis status: Secondary | ICD-10-CM | POA: Diagnosis not present

## 2018-12-04 DIAGNOSIS — M4310 Spondylolisthesis, site unspecified: Secondary | ICD-10-CM | POA: Diagnosis not present

## 2019-05-05 DIAGNOSIS — R5381 Other malaise: Secondary | ICD-10-CM | POA: Diagnosis not present

## 2019-05-05 DIAGNOSIS — E782 Mixed hyperlipidemia: Secondary | ICD-10-CM | POA: Diagnosis not present

## 2019-05-05 DIAGNOSIS — R5383 Other fatigue: Secondary | ICD-10-CM | POA: Diagnosis not present

## 2019-05-05 DIAGNOSIS — M79605 Pain in left leg: Secondary | ICD-10-CM | POA: Diagnosis not present

## 2019-05-05 DIAGNOSIS — Z720 Tobacco use: Secondary | ICD-10-CM | POA: Insufficient documentation

## 2019-05-05 DIAGNOSIS — K219 Gastro-esophageal reflux disease without esophagitis: Secondary | ICD-10-CM | POA: Diagnosis not present

## 2019-05-05 DIAGNOSIS — M5136 Other intervertebral disc degeneration, lumbar region: Secondary | ICD-10-CM | POA: Diagnosis not present

## 2019-05-05 DIAGNOSIS — Z79899 Other long term (current) drug therapy: Secondary | ICD-10-CM | POA: Diagnosis not present

## 2019-05-05 DIAGNOSIS — F5101 Primary insomnia: Secondary | ICD-10-CM | POA: Diagnosis not present

## 2019-05-05 DIAGNOSIS — I129 Hypertensive chronic kidney disease with stage 1 through stage 4 chronic kidney disease, or unspecified chronic kidney disease: Secondary | ICD-10-CM | POA: Diagnosis not present

## 2019-05-05 DIAGNOSIS — M79604 Pain in right leg: Secondary | ICD-10-CM | POA: Diagnosis not present

## 2019-05-05 DIAGNOSIS — F419 Anxiety disorder, unspecified: Secondary | ICD-10-CM | POA: Diagnosis not present

## 2019-05-05 DIAGNOSIS — N183 Chronic kidney disease, stage 3 unspecified: Secondary | ICD-10-CM | POA: Diagnosis not present

## 2019-05-20 DIAGNOSIS — I779 Disorder of arteries and arterioles, unspecified: Secondary | ICD-10-CM | POA: Diagnosis not present

## 2019-05-20 DIAGNOSIS — M79604 Pain in right leg: Secondary | ICD-10-CM | POA: Diagnosis not present

## 2019-05-21 DIAGNOSIS — I739 Peripheral vascular disease, unspecified: Secondary | ICD-10-CM | POA: Insufficient documentation

## 2019-06-10 DIAGNOSIS — E782 Mixed hyperlipidemia: Secondary | ICD-10-CM | POA: Diagnosis not present

## 2019-06-10 DIAGNOSIS — Z720 Tobacco use: Secondary | ICD-10-CM | POA: Diagnosis not present

## 2019-06-10 DIAGNOSIS — F5101 Primary insomnia: Secondary | ICD-10-CM | POA: Diagnosis not present

## 2019-06-10 DIAGNOSIS — F419 Anxiety disorder, unspecified: Secondary | ICD-10-CM | POA: Diagnosis not present

## 2019-06-10 DIAGNOSIS — I739 Peripheral vascular disease, unspecified: Secondary | ICD-10-CM | POA: Diagnosis not present

## 2019-06-26 DIAGNOSIS — M4310 Spondylolisthesis, site unspecified: Secondary | ICD-10-CM | POA: Diagnosis not present

## 2019-07-01 DIAGNOSIS — Z981 Arthrodesis status: Secondary | ICD-10-CM | POA: Diagnosis not present

## 2019-07-01 DIAGNOSIS — M5126 Other intervertebral disc displacement, lumbar region: Secondary | ICD-10-CM | POA: Diagnosis not present

## 2019-07-01 DIAGNOSIS — M4326 Fusion of spine, lumbar region: Secondary | ICD-10-CM | POA: Diagnosis not present

## 2019-07-01 DIAGNOSIS — M4310 Spondylolisthesis, site unspecified: Secondary | ICD-10-CM | POA: Diagnosis not present

## 2019-07-01 DIAGNOSIS — M5136 Other intervertebral disc degeneration, lumbar region: Secondary | ICD-10-CM | POA: Diagnosis not present

## 2019-07-01 DIAGNOSIS — I700 Atherosclerosis of aorta: Secondary | ICD-10-CM | POA: Diagnosis not present

## 2019-07-09 DIAGNOSIS — Z6829 Body mass index (BMI) 29.0-29.9, adult: Secondary | ICD-10-CM | POA: Diagnosis not present

## 2019-07-09 DIAGNOSIS — I10 Essential (primary) hypertension: Secondary | ICD-10-CM | POA: Diagnosis not present

## 2019-07-09 DIAGNOSIS — M4310 Spondylolisthesis, site unspecified: Secondary | ICD-10-CM | POA: Diagnosis not present

## 2019-11-06 DIAGNOSIS — N183 Chronic kidney disease, stage 3 unspecified: Secondary | ICD-10-CM | POA: Diagnosis not present

## 2019-11-06 DIAGNOSIS — Z23 Encounter for immunization: Secondary | ICD-10-CM | POA: Diagnosis not present

## 2019-11-06 DIAGNOSIS — Z789 Other specified health status: Secondary | ICD-10-CM | POA: Diagnosis not present

## 2019-11-06 DIAGNOSIS — E782 Mixed hyperlipidemia: Secondary | ICD-10-CM | POA: Diagnosis not present

## 2019-11-06 DIAGNOSIS — K219 Gastro-esophageal reflux disease without esophagitis: Secondary | ICD-10-CM | POA: Diagnosis not present

## 2019-11-06 DIAGNOSIS — F419 Anxiety disorder, unspecified: Secondary | ICD-10-CM | POA: Diagnosis not present

## 2019-11-06 DIAGNOSIS — I739 Peripheral vascular disease, unspecified: Secondary | ICD-10-CM | POA: Diagnosis not present

## 2019-11-06 DIAGNOSIS — R5383 Other fatigue: Secondary | ICD-10-CM | POA: Diagnosis not present

## 2019-11-06 DIAGNOSIS — I129 Hypertensive chronic kidney disease with stage 1 through stage 4 chronic kidney disease, or unspecified chronic kidney disease: Secondary | ICD-10-CM | POA: Diagnosis not present

## 2019-11-06 DIAGNOSIS — M5136 Other intervertebral disc degeneration, lumbar region: Secondary | ICD-10-CM | POA: Diagnosis not present

## 2019-11-06 DIAGNOSIS — Z79899 Other long term (current) drug therapy: Secondary | ICD-10-CM | POA: Diagnosis not present

## 2019-11-06 DIAGNOSIS — F5101 Primary insomnia: Secondary | ICD-10-CM | POA: Diagnosis not present

## 2019-11-06 DIAGNOSIS — R5381 Other malaise: Secondary | ICD-10-CM | POA: Diagnosis not present

## 2019-11-06 DIAGNOSIS — Z0000 Encounter for general adult medical examination without abnormal findings: Secondary | ICD-10-CM | POA: Diagnosis not present

## 2019-11-12 DIAGNOSIS — M4310 Spondylolisthesis, site unspecified: Secondary | ICD-10-CM | POA: Diagnosis not present

## 2019-11-19 DIAGNOSIS — M461 Sacroiliitis, not elsewhere classified: Secondary | ICD-10-CM | POA: Diagnosis not present

## 2019-11-19 DIAGNOSIS — M47816 Spondylosis without myelopathy or radiculopathy, lumbar region: Secondary | ICD-10-CM | POA: Diagnosis not present

## 2019-11-19 DIAGNOSIS — G894 Chronic pain syndrome: Secondary | ICD-10-CM | POA: Diagnosis not present

## 2019-11-19 DIAGNOSIS — Z9889 Other specified postprocedural states: Secondary | ICD-10-CM | POA: Diagnosis not present

## 2019-12-22 DIAGNOSIS — Z9889 Other specified postprocedural states: Secondary | ICD-10-CM | POA: Diagnosis not present

## 2019-12-22 DIAGNOSIS — M47816 Spondylosis without myelopathy or radiculopathy, lumbar region: Secondary | ICD-10-CM | POA: Diagnosis not present

## 2019-12-22 DIAGNOSIS — M533 Sacrococcygeal disorders, not elsewhere classified: Secondary | ICD-10-CM | POA: Diagnosis not present

## 2020-02-12 DIAGNOSIS — M533 Sacrococcygeal disorders, not elsewhere classified: Secondary | ICD-10-CM | POA: Diagnosis not present

## 2020-03-10 DIAGNOSIS — I708 Atherosclerosis of other arteries: Secondary | ICD-10-CM | POA: Diagnosis not present

## 2020-03-10 DIAGNOSIS — I700 Atherosclerosis of aorta: Secondary | ICD-10-CM | POA: Diagnosis not present

## 2020-03-10 DIAGNOSIS — Z6829 Body mass index (BMI) 29.0-29.9, adult: Secondary | ICD-10-CM | POA: Diagnosis not present

## 2020-04-06 ENCOUNTER — Other Ambulatory Visit: Payer: Self-pay

## 2020-04-06 DIAGNOSIS — I708 Atherosclerosis of other arteries: Secondary | ICD-10-CM

## 2020-04-06 DIAGNOSIS — I700 Atherosclerosis of aorta: Secondary | ICD-10-CM

## 2020-04-08 ENCOUNTER — Other Ambulatory Visit: Payer: Self-pay

## 2020-04-08 ENCOUNTER — Ambulatory Visit (HOSPITAL_COMMUNITY)
Admission: RE | Admit: 2020-04-08 | Discharge: 2020-04-08 | Disposition: A | Discharge status: Home / Self Care | Payer: PPO | Source: Ambulatory Visit | Attending: Specialist | Admitting: Specialist

## 2020-04-08 ENCOUNTER — Encounter: Payer: Self-pay | Admitting: Vascular Surgery

## 2020-04-08 ENCOUNTER — Ambulatory Visit (INDEPENDENT_AMBULATORY_CARE_PROVIDER_SITE_OTHER): Payer: PPO | Admitting: Vascular Surgery

## 2020-04-08 VITALS — BP 144/76 | HR 56 | Temp 97.7°F | Resp 20 | Ht 65.0 in | Wt 180.0 lb

## 2020-04-08 DIAGNOSIS — I739 Peripheral vascular disease, unspecified: Secondary | ICD-10-CM

## 2020-04-08 DIAGNOSIS — I708 Atherosclerosis of other arteries: Secondary | ICD-10-CM | POA: Diagnosis not present

## 2020-04-08 DIAGNOSIS — I700 Atherosclerosis of aorta: Secondary | ICD-10-CM | POA: Insufficient documentation

## 2020-04-08 NOTE — Progress Notes (Signed)
Referring Physician: Dr. Dutch Quint  Patient name: Natalie Pollard MRN: [id number] DOB: 1945-05-16 Sex: female  REASON FOR CONSULT: Leg pain rule out peripheral arterial disease  HPI: Natalie Pollard is a 75 y.o. female, who complains of back and leg pain.  She is a poor historian.  She states that she has pain that occurs on the anterior aspect of her thighs but is not really able to provide history of whether or not this occurs at rest or while sitting.  She states that she is not able to walk much more than about 10 yards because both of her legs become very fatigued.  She also has some balance issues.  She states that she had an episode of back pain about 2 years ago.  She then subsequently had a back operation.  She has had persistent back pain and leg pain since the operation.  She is a current smoker of about a half a pack per day.  She does not want to quit.  She was seen recently by Dr. Dutch Quint and it was noted on a CT scan of the lumbar spine at [facility] that she had aortoiliac calcification.  She was sent here for further referral and possible evaluation of aortoiliac occlusive disease as a possible etiology for her leg pain.  Of note she also had ABIs performed at [facility] March 2021 which showed an ABI on the right side of 0.94 left was 0.82.  Patient is essentially in a wheelchair due to her leg weakness and balance issues.  Other medical problems include hyperlipidemia and hypertension both of which are currently stable.  Past Medical History:  Diagnosis Date  . Anxiety   . Chronic kidney disease    stage 3  . GERD (gastroesophageal reflux disease)   . High risk medication use   . Hypertension   . Insomnia   . Mixed hyperlipidemia    Past Surgical History:  Procedure Laterality Date  . OVARIAN CYST REMOVAL      History reviewed. No pertinent family history.  SOCIAL HISTORY: Social History   Socioeconomic History  . Marital status: Single    Spouse  name: Not on file  . Number of children: Not on file  . Years of education: Not on file  . Highest education level: Not on file  Occupational History  . Not on file  Tobacco Use  . Smoking status: Current Every Day Smoker    Packs/day: 0.50  . Smokeless tobacco: Never Used  Vaping Use  . Vaping Use: Never used  Substance and Sexual Activity  . Alcohol use: Never  . Drug use: Never  . Sexual activity: Not on file  Other Topics Concern  . Not on file  Social History Narrative  . Not on file   Social Determinants of Health   Financial Resource Strain: Not on file  Food Insecurity: Not on file  Transportation Needs: Not on file  Physical Activity: Not on file  Stress: Not on file  Social Connections: Not on file  Intimate Partner Violence: Not on file    Allergies  Allergen Reactions  . Azithromycin Other (See Comments)    Abdominal Pain  . Codeine Nausea And Vomiting    Current Outpatient Medications  Medication Sig Dispense Refill  . amLODipine (NORVASC) 10 MG tablet Take 10 mg by mouth daily.    . clonazePAM (KLONOPIN) 0.5 MG tablet Take 0.5 mg by mouth at bedtime.    . cloNIDine (CATAPRES)  0.1 MG tablet Take 0.05 mg by mouth 2 (two) times daily.    . hydrochlorothiazide (MICROZIDE) 12.5 MG capsule Take 12.5 mg by mouth daily.    Marland Kitchen HYDROcodone-acetaminophen (NORCO/VICODIN) 5-325 MG tablet Take 1 tablet by mouth every 6 (six) hours as needed.    Marland Kitchen losartan (COZAAR) 100 MG tablet Take 100 mg by mouth daily.    Marland Kitchen oxyCODONE 10 MG TABS Take 1 tablet (10 mg total) by mouth every 4 (four) hours as needed for severe pain ((score 7 to 10)). 30 tablet 0  . oxymetazoline (AFRIN) 0.05 % nasal spray Place 1 spray into both nostrils 2 (two) times daily as needed for congestion.     No current facility-administered medications for this visit.    ROS:   General:  No weight loss, Fever, chills  HEENT: No recent headaches, no nasal bleeding, no visual changes, no sore  throat  Neurologic: No dizziness, blackouts, seizures. No recent symptoms of stroke or mini- stroke. No recent episodes of slurred speech, or temporary blindness.  Cardiac: No recent episodes of chest pain/pressure, no shortness of breath at rest.  + shortness of breath with exertion.  Denies history of atrial fibrillation or irregular heartbeat  Vascular: No history of rest pain in feet.  No history of claudication.  No history of non-healing ulcer, No history of DVT   Pulmonary: No home oxygen, no productive cough, no hemoptysis,  No asthma or wheezing  Musculoskeletal:  [ ]  Arthritis, [X]  Low back pain,  [ ]  Joint pain  Hematologic:No history of hypercoagulable state.  No history of easy bleeding.  No history of anemia  Gastrointestinal: No hematochezia or melena,  No gastroesophageal reflux, no trouble swallowing  Urinary: [ ]  chronic Kidney disease, [ ]  on HD - [ ]  MWF or [ ]  TTHS, [ ]  Burning with urination, [ ]  Frequent urination, [ ]  Difficulty urinating;   Skin: No rashes  Psychological: No history of anxiety,  No history of depression   Physical Examination  Vitals:   04/08/20 0938  BP: (!) 144/76  Pulse: (!) 56  Resp: 20  Temp: 97.7 F (36.5 C)  SpO2: 99%  Weight: 180 lb (81.6 kg)  Height: 5\' 5"  (1.651 m)    Body mass index is 29.95 kg/m.  General:  Alert and oriented, no acute distress HEENT: Normal Neck: No JVD Cardiac: Regular Rate and Rhythm Abdomen: Soft, non-tender, non-distended, no mass Skin: No rash or ulcer Extremity Pulses:  2+ radial, brachial, femoral, dorsalis pedis, posterior tibial pulses bilaterally Musculoskeletal: No deformity or edema  Neurologic: Upper and lower extremity motor 5/5 and symmetric  DATA:  Patient had an aortoiliac duplex scan today which showed biphasic flow and no obvious flow-limiting stenosis.  However there was some overlying bowel gas obscuring some views.  There was no evidence of aneurysm.  I reviewed and  interpreted the study.  I also reviewed the images of the patient's recent lumbar spine CT scan performed at [facility].  This does show a large amount of calcification at the distal aorta bifurcation as well as the proximal common iliac arteries bilaterally.  Right side was more heavily calcified than the left.  ASSESSMENT: Patient with chronic back pain.  I discussed with the patient and her daughter today that blood vessels do not usually cause back pain.  I do not believe peripheral arterial disease as the cause of her back pain.  However, she does have calcification of her aortoiliac segments on both sides  on review of the CT scan this may have some obstruction element.  I am not sure if her symptoms in her legs are being caused by this as she is minimally ambulatory.  Her ABIs were approximately 1 year ago and were not completely normal but only mildly decreased bilaterally.  The patient overall is fairly debilitated.   PLAN: CT angiogram abdomen pelvis with lower extremity runoff to definitively rule out peripheral arterial disease as a possible etiology for her leg pain.  Again I discussed with the patient and her daughter today that her back pain is not related to her atherosclerosis.  The patient will follow up with me in a few weeks after her CT scan.   Fabienne Bruns, MD [facility] Office: [phone]

## 2020-04-09 ENCOUNTER — Other Ambulatory Visit: Payer: Self-pay

## 2020-04-09 DIAGNOSIS — M5136 Other intervertebral disc degeneration, lumbar region: Secondary | ICD-10-CM | POA: Diagnosis not present

## 2020-04-09 DIAGNOSIS — Z981 Arthrodesis status: Secondary | ICD-10-CM | POA: Diagnosis not present

## 2020-04-09 DIAGNOSIS — I739 Peripheral vascular disease, unspecified: Secondary | ICD-10-CM

## 2020-04-13 DIAGNOSIS — M79604 Pain in right leg: Secondary | ICD-10-CM | POA: Diagnosis not present

## 2020-04-13 DIAGNOSIS — I739 Peripheral vascular disease, unspecified: Secondary | ICD-10-CM | POA: Diagnosis not present

## 2020-04-13 DIAGNOSIS — M79605 Pain in left leg: Secondary | ICD-10-CM | POA: Diagnosis not present

## 2020-04-13 DIAGNOSIS — G8929 Other chronic pain: Secondary | ICD-10-CM | POA: Diagnosis not present

## 2020-04-13 DIAGNOSIS — I701 Atherosclerosis of renal artery: Secondary | ICD-10-CM | POA: Diagnosis not present

## 2020-04-29 ENCOUNTER — Ambulatory Visit: Payer: PPO | Admitting: Vascular Surgery

## 2020-04-29 ENCOUNTER — Encounter: Payer: Self-pay | Admitting: Vascular Surgery

## 2020-04-29 ENCOUNTER — Ambulatory Visit (HOSPITAL_COMMUNITY)
Admission: RE | Admit: 2020-04-29 | Discharge: 2020-04-29 | Disposition: A | Discharge status: Home / Self Care | Payer: PPO | Source: Ambulatory Visit | Attending: Vascular Surgery | Admitting: Vascular Surgery

## 2020-04-29 ENCOUNTER — Other Ambulatory Visit: Payer: Self-pay

## 2020-04-29 VITALS — BP 122/68 | HR 62 | Temp 97.7°F | Resp 20 | Ht 65.0 in | Wt 180.0 lb

## 2020-04-29 DIAGNOSIS — I739 Peripheral vascular disease, unspecified: Secondary | ICD-10-CM | POA: Diagnosis not present

## 2020-04-29 NOTE — Progress Notes (Addendum)
Patient is a 75 year old female who returns for follow-up today.  She was last seen on April 08, 2020.  Her primary complaint is bilateral lower extremity weakness.  She also has chronic back pain.  She returns today after recent CT angio with runoff.  She states she has still has difficulty walking and weak legs.  She is able to ambulate with a walker.  She has no nonhealing wounds on her feet.  Currently smokes a half a pack of cigarettes per day.  She has follow-up scheduled with Dr. Dutch Quint in a few weeks regarding her back problems.  Past Surgical History:  Procedure Laterality Date  . OVARIAN CYST REMOVAL      Past Medical History:  Diagnosis Date  . Anxiety   . Chronic kidney disease    stage 3  . GERD (gastroesophageal reflux disease)   . High risk medication use   . Hypertension   . Insomnia   . Mixed hyperlipidemia     Current Outpatient Medications on File Prior to Visit  Medication Sig Dispense Refill  . amLODipine (NORVASC) 10 MG tablet Take 10 mg by mouth daily.    . clonazePAM (KLONOPIN) 0.5 MG tablet Take 0.5 mg by mouth at bedtime.    . clonazePAM (KLONOPIN) 1 MG tablet Take 1 mg by mouth 2 (two) times daily as needed.    . cloNIDine (CATAPRES) 0.1 MG tablet Take 0.05 mg by mouth 2 (two) times daily.    . hydrochlorothiazide (MICROZIDE) 12.5 MG capsule Take 12.5 mg by mouth daily.    Marland Kitchen HYDROcodone-acetaminophen (NORCO/VICODIN) 5-325 MG tablet Take 1 tablet by mouth every 6 (six) hours as needed.    Marland Kitchen losartan (COZAAR) 100 MG tablet Take 100 mg by mouth daily.    Marland Kitchen oxyCODONE 10 MG TABS Take 1 tablet (10 mg total) by mouth every 4 (four) hours as needed for severe pain ((score 7 to 10)). 30 tablet 0  . oxymetazoline (AFRIN) 0.05 % nasal spray Place 1 spray into both nostrils 2 (two) times daily as needed for congestion.     No current facility-administered medications on file prior to visit.     Social History   Socioeconomic History  . Marital status: Single     Spouse name: Not on file  . Number of children: Not on file  . Years of education: Not on file  . Highest education level: Not on file  Occupational History  . Not on file  Tobacco Use  . Smoking status: Current Every Day Smoker    Packs/day: 0.50  . Smokeless tobacco: Never Used  Vaping Use  . Vaping Use: Never used  Substance and Sexual Activity  . Alcohol use: Never  . Drug use: Never  . Sexual activity: Not on file  Other Topics Concern  . Not on file  Social History Narrative  . Not on file   Social Determinants of Health   Financial Resource Strain: Not on file  Food Insecurity: Not on file  Transportation Needs: Not on file  Physical Activity: Not on file  Stress: Not on file  Social Connections: Not on file  Intimate Partner Violence: Not on file   Review of systems: She has shortness of breath with exertion.  She does not have chest pain.  Physical exam:  Vitals:   04/29/20 1545  BP: 122/68  Pulse: 62  Resp: 20  Temp: 97.7 F (36.5 C)  SpO2: 97%  Weight: 180 lb (81.6 kg)  Height: 5\' 5"  (  1.651 m)    Extremities: 2+ dorsalis pedis pulses bilaterally  Data: I reviewed the images from her CT angiogram today.  This was a CT scan done at [facility].  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  The left common iliac artery had possibly a greater than 50% stenosis.  On my review of all these images she does have some calcification of the common iliac arteries but I would say most likely 50% or less bilaterally.  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  She also has a left renal artery stenosis.  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  Patient had bilateral ABIs today which were triphasic greater than 1 and normal bilaterally.  Assessment: I had a lengthy discussion with the  patient and her daughter today.  She may have some mild narrowing of her common iliac arteries bilaterally.  It is difficult to determine the precise amount of narrowing secondary to calcification and overlying orthopedic hardware.  However, on my review the images and also consistent with out of the radiology read is probably this is most likely 50% or less.  I am not sure that this is the ultimate etiology of her problem although may be contributing subsymptoms.  She has palpable pulses in her feet and has normal ABIs.  It is reassuring that she does have completely adequate perfusion and is not at risk of limb loss with her current amount of perfusion to her feet.  Plan: Patient will follow up with [place] in 6 months time for repeat ABIs and an aortic ultrasound.  If Dr. Dutch Quint feels that there are no further interventions or any other possible etiology for her pain we could consider lower extremity arteriogram I did discuss with the patient and her daughter today that this may be fairly low yield although we could certainly do this to completely rule out any iliac stenosis as an etiology.  They will think about this and get back to me.  Otherwise I will see her again in 6 months time.  As far as the renal artery stenosis is concerned I would not do anything to intervene on the occluded right renal as the kidney has already atrophied.  If she develops worsening kidney function or hard to control blood pressure consideration could be given for angioplasty or stenting of her left renal artery.  Fabienne Bruns, MD [facility] Office: [phone]

## 2020-05-03 ENCOUNTER — Other Ambulatory Visit: Payer: Self-pay

## 2020-05-03 DIAGNOSIS — I739 Peripheral vascular disease, unspecified: Secondary | ICD-10-CM

## 2020-05-05 DIAGNOSIS — M5136 Other intervertebral disc degeneration, lumbar region: Secondary | ICD-10-CM | POA: Diagnosis not present

## 2020-05-05 DIAGNOSIS — N1831 Chronic kidney disease, stage 3a: Secondary | ICD-10-CM | POA: Diagnosis not present

## 2020-05-05 DIAGNOSIS — F5101 Primary insomnia: Secondary | ICD-10-CM | POA: Diagnosis not present

## 2020-05-05 DIAGNOSIS — Z79899 Other long term (current) drug therapy: Secondary | ICD-10-CM | POA: Diagnosis not present

## 2020-05-05 DIAGNOSIS — E782 Mixed hyperlipidemia: Secondary | ICD-10-CM | POA: Diagnosis not present

## 2020-05-05 DIAGNOSIS — Z789 Other specified health status: Secondary | ICD-10-CM | POA: Diagnosis not present

## 2020-05-05 DIAGNOSIS — R5383 Other fatigue: Secondary | ICD-10-CM | POA: Diagnosis not present

## 2020-05-05 DIAGNOSIS — I129 Hypertensive chronic kidney disease with stage 1 through stage 4 chronic kidney disease, or unspecified chronic kidney disease: Secondary | ICD-10-CM | POA: Diagnosis not present

## 2020-05-05 DIAGNOSIS — R5381 Other malaise: Secondary | ICD-10-CM | POA: Diagnosis not present

## 2020-05-05 DIAGNOSIS — K219 Gastro-esophageal reflux disease without esophagitis: Secondary | ICD-10-CM | POA: Diagnosis not present

## 2020-05-05 DIAGNOSIS — F419 Anxiety disorder, unspecified: Secondary | ICD-10-CM | POA: Diagnosis not present

## 2020-05-05 DIAGNOSIS — D6489 Other specified anemias: Secondary | ICD-10-CM | POA: Diagnosis not present

## 2020-05-05 DIAGNOSIS — I739 Peripheral vascular disease, unspecified: Secondary | ICD-10-CM | POA: Diagnosis not present

## 2020-05-13 IMAGING — RF LUMBAR SPINE - 2-3 VIEW
1 series · 2 of 2 positions shown · non-contrast
Comparison: None.

CLINICAL DATA: Intraoperative imaging for L4-5 laminectomy and
fusion.

EXAM:
DG C-ARM 61-120 MIN; LUMBAR SPINE - 2-3 VIEW

[Series 1: run · 2 of 2 slices shown]
[im 1/2]
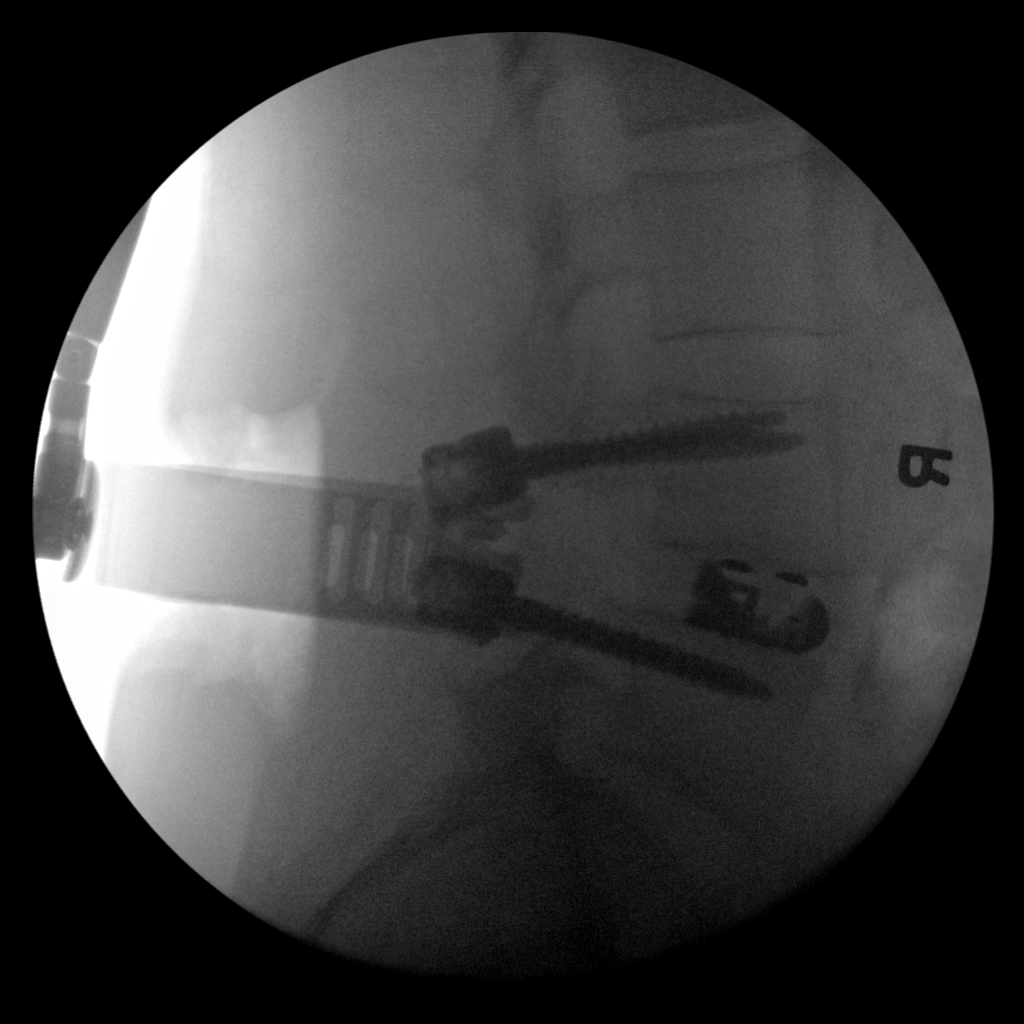
[im 2/2]
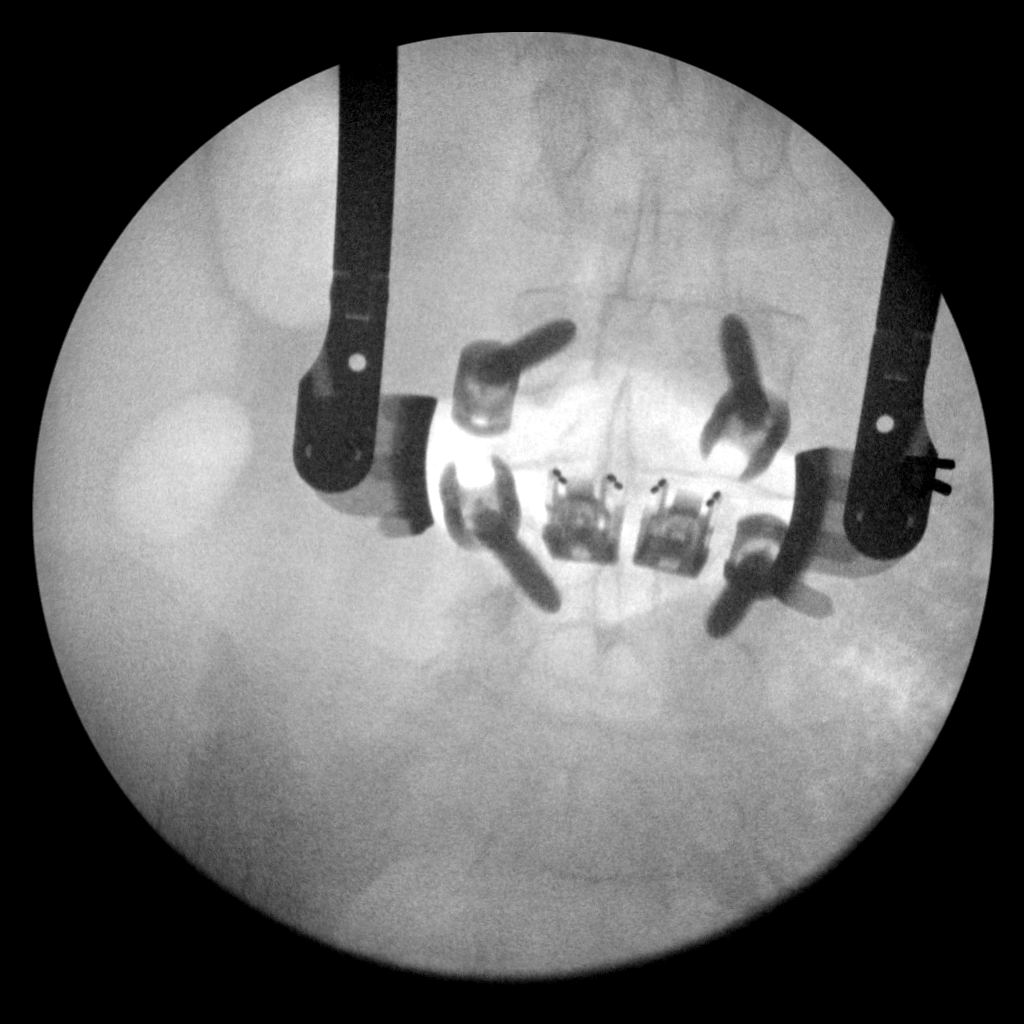

[2 of 2 positions shown; findings below may reference images not displayed]

FINDINGS: Two fluoroscopic intraoperative spot views of the lumbar spine
demonstrate pedicle screws and interbody spacers in place at L4-5.
IMPRESSION: L4-5 in progress.

## 2020-05-20 DIAGNOSIS — M5416 Radiculopathy, lumbar region: Secondary | ICD-10-CM | POA: Diagnosis not present

## 2020-06-10 DIAGNOSIS — Z981 Arthrodesis status: Secondary | ICD-10-CM | POA: Diagnosis not present

## 2020-06-10 DIAGNOSIS — M5416 Radiculopathy, lumbar region: Secondary | ICD-10-CM | POA: Diagnosis not present

## 2020-06-10 DIAGNOSIS — M5136 Other intervertebral disc degeneration, lumbar region: Secondary | ICD-10-CM | POA: Diagnosis not present

## 2020-06-24 DIAGNOSIS — K219 Gastro-esophageal reflux disease without esophagitis: Secondary | ICD-10-CM | POA: Diagnosis not present

## 2020-06-24 DIAGNOSIS — N390 Urinary tract infection, site not specified: Secondary | ICD-10-CM | POA: Diagnosis not present

## 2020-06-24 DIAGNOSIS — Z79899 Other long term (current) drug therapy: Secondary | ICD-10-CM | POA: Diagnosis not present

## 2020-06-24 DIAGNOSIS — K429 Umbilical hernia without obstruction or gangrene: Secondary | ICD-10-CM | POA: Diagnosis not present

## 2020-06-24 DIAGNOSIS — E860 Dehydration: Secondary | ICD-10-CM | POA: Diagnosis not present

## 2020-06-24 DIAGNOSIS — I10 Essential (primary) hypertension: Secondary | ICD-10-CM | POA: Diagnosis not present

## 2020-06-24 DIAGNOSIS — F17210 Nicotine dependence, cigarettes, uncomplicated: Secondary | ICD-10-CM | POA: Diagnosis not present

## 2020-06-24 DIAGNOSIS — M545 Low back pain, unspecified: Secondary | ICD-10-CM | POA: Diagnosis not present

## 2020-06-24 DIAGNOSIS — Z20822 Contact with and (suspected) exposure to covid-19: Secondary | ICD-10-CM | POA: Diagnosis not present

## 2020-06-24 DIAGNOSIS — N281 Cyst of kidney, acquired: Secondary | ICD-10-CM | POA: Diagnosis not present

## 2020-06-24 DIAGNOSIS — E785 Hyperlipidemia, unspecified: Secondary | ICD-10-CM | POA: Diagnosis not present

## 2020-06-24 DIAGNOSIS — R531 Weakness: Secondary | ICD-10-CM | POA: Diagnosis not present

## 2020-06-28 DIAGNOSIS — F17210 Nicotine dependence, cigarettes, uncomplicated: Secondary | ICD-10-CM | POA: Diagnosis not present

## 2020-06-28 DIAGNOSIS — K219 Gastro-esophageal reflux disease without esophagitis: Secondary | ICD-10-CM | POA: Diagnosis not present

## 2020-06-28 DIAGNOSIS — I10 Essential (primary) hypertension: Secondary | ICD-10-CM | POA: Diagnosis not present

## 2020-06-28 DIAGNOSIS — E860 Dehydration: Secondary | ICD-10-CM | POA: Diagnosis not present

## 2020-06-28 DIAGNOSIS — E785 Hyperlipidemia, unspecified: Secondary | ICD-10-CM | POA: Diagnosis not present

## 2020-06-28 DIAGNOSIS — I129 Hypertensive chronic kidney disease with stage 1 through stage 4 chronic kidney disease, or unspecified chronic kidney disease: Secondary | ICD-10-CM | POA: Diagnosis not present

## 2020-07-06 DIAGNOSIS — R5381 Other malaise: Secondary | ICD-10-CM | POA: Diagnosis not present

## 2020-07-06 DIAGNOSIS — Z09 Encounter for follow-up examination after completed treatment for conditions other than malignant neoplasm: Secondary | ICD-10-CM | POA: Diagnosis not present

## 2020-07-06 DIAGNOSIS — Z720 Tobacco use: Secondary | ICD-10-CM | POA: Diagnosis not present

## 2020-07-06 DIAGNOSIS — I10 Essential (primary) hypertension: Secondary | ICD-10-CM | POA: Diagnosis not present

## 2020-07-06 DIAGNOSIS — R5383 Other fatigue: Secondary | ICD-10-CM | POA: Diagnosis not present

## 2020-07-06 DIAGNOSIS — E538 Deficiency of other specified B group vitamins: Secondary | ICD-10-CM | POA: Diagnosis not present

## 2020-07-11 DIAGNOSIS — E871 Hypo-osmolality and hyponatremia: Secondary | ICD-10-CM | POA: Diagnosis not present

## 2020-07-11 DIAGNOSIS — Z792 Long term (current) use of antibiotics: Secondary | ICD-10-CM | POA: Diagnosis not present

## 2020-07-11 DIAGNOSIS — I739 Peripheral vascular disease, unspecified: Secondary | ICD-10-CM | POA: Diagnosis not present

## 2020-07-11 DIAGNOSIS — M6259 Muscle wasting and atrophy, not elsewhere classified, multiple sites: Secondary | ICD-10-CM | POA: Diagnosis not present

## 2020-07-11 DIAGNOSIS — Z885 Allergy status to narcotic agent status: Secondary | ICD-10-CM | POA: Diagnosis not present

## 2020-07-11 DIAGNOSIS — Z20822 Contact with and (suspected) exposure to covid-19: Secondary | ICD-10-CM | POA: Diagnosis not present

## 2020-07-11 DIAGNOSIS — R531 Weakness: Secondary | ICD-10-CM | POA: Diagnosis not present

## 2020-07-11 DIAGNOSIS — F17210 Nicotine dependence, cigarettes, uncomplicated: Secondary | ICD-10-CM | POA: Diagnosis not present

## 2020-07-11 DIAGNOSIS — Z79899 Other long term (current) drug therapy: Secondary | ICD-10-CM | POA: Diagnosis not present

## 2020-07-11 DIAGNOSIS — G319 Degenerative disease of nervous system, unspecified: Secondary | ICD-10-CM | POA: Diagnosis not present

## 2020-07-11 DIAGNOSIS — B9561 Methicillin susceptible Staphylococcus aureus infection as the cause of diseases classified elsewhere: Secondary | ICD-10-CM | POA: Diagnosis not present

## 2020-07-11 DIAGNOSIS — N390 Urinary tract infection, site not specified: Secondary | ICD-10-CM | POA: Diagnosis not present

## 2020-07-11 DIAGNOSIS — I6523 Occlusion and stenosis of bilateral carotid arteries: Secondary | ICD-10-CM | POA: Diagnosis not present

## 2020-07-11 DIAGNOSIS — K219 Gastro-esophageal reflux disease without esophagitis: Secondary | ICD-10-CM | POA: Diagnosis not present

## 2020-07-11 DIAGNOSIS — Z23 Encounter for immunization: Secondary | ICD-10-CM | POA: Diagnosis not present

## 2020-07-11 DIAGNOSIS — I10 Essential (primary) hypertension: Secondary | ICD-10-CM | POA: Diagnosis not present

## 2020-07-16 DIAGNOSIS — B9561 Methicillin susceptible Staphylococcus aureus infection as the cause of diseases classified elsewhere: Secondary | ICD-10-CM | POA: Diagnosis not present

## 2020-07-16 DIAGNOSIS — R918 Other nonspecific abnormal finding of lung field: Secondary | ICD-10-CM | POA: Diagnosis not present

## 2020-07-16 DIAGNOSIS — Z792 Long term (current) use of antibiotics: Secondary | ICD-10-CM | POA: Diagnosis not present

## 2020-07-16 DIAGNOSIS — I739 Peripheral vascular disease, unspecified: Secondary | ICD-10-CM | POA: Diagnosis not present

## 2020-07-16 DIAGNOSIS — E782 Mixed hyperlipidemia: Secondary | ICD-10-CM | POA: Diagnosis not present

## 2020-07-16 DIAGNOSIS — G894 Chronic pain syndrome: Secondary | ICD-10-CM | POA: Diagnosis not present

## 2020-07-16 DIAGNOSIS — N390 Urinary tract infection, site not specified: Secondary | ICD-10-CM | POA: Diagnosis not present

## 2020-07-16 DIAGNOSIS — Z20822 Contact with and (suspected) exposure to covid-19: Secondary | ICD-10-CM | POA: Diagnosis not present

## 2020-07-16 DIAGNOSIS — N1831 Chronic kidney disease, stage 3a: Secondary | ICD-10-CM | POA: Diagnosis not present

## 2020-07-16 DIAGNOSIS — K219 Gastro-esophageal reflux disease without esophagitis: Secondary | ICD-10-CM | POA: Diagnosis not present

## 2020-07-16 DIAGNOSIS — Z87891 Personal history of nicotine dependence: Secondary | ICD-10-CM | POA: Diagnosis not present

## 2020-07-16 DIAGNOSIS — F17200 Nicotine dependence, unspecified, uncomplicated: Secondary | ICD-10-CM | POA: Diagnosis not present

## 2020-07-16 DIAGNOSIS — Z515 Encounter for palliative care: Secondary | ICD-10-CM | POA: Diagnosis not present

## 2020-07-16 DIAGNOSIS — J449 Chronic obstructive pulmonary disease, unspecified: Secondary | ICD-10-CM | POA: Diagnosis not present

## 2020-07-16 DIAGNOSIS — J101 Influenza due to other identified influenza virus with other respiratory manifestations: Secondary | ICD-10-CM | POA: Diagnosis not present

## 2020-07-16 DIAGNOSIS — F17210 Nicotine dependence, cigarettes, uncomplicated: Secondary | ICD-10-CM | POA: Diagnosis not present

## 2020-07-16 DIAGNOSIS — I509 Heart failure, unspecified: Secondary | ICD-10-CM | POA: Diagnosis not present

## 2020-07-16 DIAGNOSIS — R5381 Other malaise: Secondary | ICD-10-CM | POA: Diagnosis not present

## 2020-07-16 DIAGNOSIS — M6259 Muscle wasting and atrophy, not elsewhere classified, multiple sites: Secondary | ICD-10-CM | POA: Diagnosis not present

## 2020-07-16 DIAGNOSIS — E871 Hypo-osmolality and hyponatremia: Secondary | ICD-10-CM | POA: Diagnosis not present

## 2020-07-16 DIAGNOSIS — I5033 Acute on chronic diastolic (congestive) heart failure: Secondary | ICD-10-CM | POA: Diagnosis not present

## 2020-07-16 DIAGNOSIS — Z79899 Other long term (current) drug therapy: Secondary | ICD-10-CM | POA: Diagnosis not present

## 2020-07-16 DIAGNOSIS — J441 Chronic obstructive pulmonary disease with (acute) exacerbation: Secondary | ICD-10-CM | POA: Diagnosis not present

## 2020-07-16 DIAGNOSIS — R531 Weakness: Secondary | ICD-10-CM | POA: Diagnosis not present

## 2020-07-16 DIAGNOSIS — Z885 Allergy status to narcotic agent status: Secondary | ICD-10-CM | POA: Diagnosis not present

## 2020-07-16 DIAGNOSIS — Z79891 Long term (current) use of opiate analgesic: Secondary | ICD-10-CM | POA: Diagnosis not present

## 2020-07-16 DIAGNOSIS — I10 Essential (primary) hypertension: Secondary | ICD-10-CM | POA: Diagnosis not present

## 2020-07-16 DIAGNOSIS — I130 Hypertensive heart and chronic kidney disease with heart failure and stage 1 through stage 4 chronic kidney disease, or unspecified chronic kidney disease: Secondary | ICD-10-CM | POA: Diagnosis not present

## 2020-07-16 DIAGNOSIS — Z66 Do not resuscitate: Secondary | ICD-10-CM | POA: Diagnosis not present

## 2020-07-16 DIAGNOSIS — Z23 Encounter for immunization: Secondary | ICD-10-CM | POA: Diagnosis not present

## 2020-07-18 DIAGNOSIS — G8929 Other chronic pain: Secondary | ICD-10-CM | POA: Diagnosis not present

## 2020-07-18 DIAGNOSIS — J101 Influenza due to other identified influenza virus with other respiratory manifestations: Secondary | ICD-10-CM | POA: Diagnosis not present

## 2020-07-18 DIAGNOSIS — Z79891 Long term (current) use of opiate analgesic: Secondary | ICD-10-CM | POA: Diagnosis not present

## 2020-07-18 DIAGNOSIS — I130 Hypertensive heart and chronic kidney disease with heart failure and stage 1 through stage 4 chronic kidney disease, or unspecified chronic kidney disease: Secondary | ICD-10-CM | POA: Diagnosis not present

## 2020-07-18 DIAGNOSIS — Z515 Encounter for palliative care: Secondary | ICD-10-CM | POA: Diagnosis not present

## 2020-07-18 DIAGNOSIS — R062 Wheezing: Secondary | ICD-10-CM | POA: Diagnosis not present

## 2020-07-18 DIAGNOSIS — R5381 Other malaise: Secondary | ICD-10-CM | POA: Diagnosis not present

## 2020-07-18 DIAGNOSIS — I10 Essential (primary) hypertension: Secondary | ICD-10-CM | POA: Diagnosis not present

## 2020-07-18 DIAGNOSIS — Z87891 Personal history of nicotine dependence: Secondary | ICD-10-CM | POA: Diagnosis not present

## 2020-07-18 DIAGNOSIS — I509 Heart failure, unspecified: Secondary | ICD-10-CM | POA: Diagnosis not present

## 2020-07-18 DIAGNOSIS — G894 Chronic pain syndrome: Secondary | ICD-10-CM | POA: Diagnosis not present

## 2020-07-18 DIAGNOSIS — I491 Atrial premature depolarization: Secondary | ICD-10-CM | POA: Diagnosis not present

## 2020-07-18 DIAGNOSIS — R638 Other symptoms and signs concerning food and fluid intake: Secondary | ICD-10-CM | POA: Diagnosis not present

## 2020-07-18 DIAGNOSIS — R5383 Other fatigue: Secondary | ICD-10-CM | POA: Diagnosis not present

## 2020-07-18 DIAGNOSIS — I959 Hypotension, unspecified: Secondary | ICD-10-CM | POA: Diagnosis not present

## 2020-07-18 DIAGNOSIS — N1831 Chronic kidney disease, stage 3a: Secondary | ICD-10-CM | POA: Diagnosis not present

## 2020-07-18 DIAGNOSIS — R0600 Dyspnea, unspecified: Secondary | ICD-10-CM | POA: Diagnosis not present

## 2020-07-18 DIAGNOSIS — R918 Other nonspecific abnormal finding of lung field: Secondary | ICD-10-CM | POA: Diagnosis not present

## 2020-07-18 DIAGNOSIS — Z720 Tobacco use: Secondary | ICD-10-CM | POA: Diagnosis not present

## 2020-07-18 DIAGNOSIS — E782 Mixed hyperlipidemia: Secondary | ICD-10-CM | POA: Diagnosis not present

## 2020-07-18 DIAGNOSIS — N183 Chronic kidney disease, stage 3 unspecified: Secondary | ICD-10-CM | POA: Diagnosis not present

## 2020-07-18 DIAGNOSIS — J9601 Acute respiratory failure with hypoxia: Secondary | ICD-10-CM | POA: Diagnosis not present

## 2020-07-18 DIAGNOSIS — I739 Peripheral vascular disease, unspecified: Secondary | ICD-10-CM | POA: Diagnosis not present

## 2020-07-18 DIAGNOSIS — F17200 Nicotine dependence, unspecified, uncomplicated: Secondary | ICD-10-CM | POA: Diagnosis not present

## 2020-07-18 DIAGNOSIS — R9431 Abnormal electrocardiogram [ECG] [EKG]: Secondary | ICD-10-CM | POA: Diagnosis not present

## 2020-07-18 DIAGNOSIS — Z20822 Contact with and (suspected) exposure to covid-19: Secondary | ICD-10-CM | POA: Diagnosis not present

## 2020-07-18 DIAGNOSIS — K219 Gastro-esophageal reflux disease without esophagitis: Secondary | ICD-10-CM | POA: Diagnosis not present

## 2020-07-18 DIAGNOSIS — Z66 Do not resuscitate: Secondary | ICD-10-CM | POA: Diagnosis not present

## 2020-07-18 DIAGNOSIS — Z743 Need for continuous supervision: Secondary | ICD-10-CM | POA: Diagnosis not present

## 2020-07-18 DIAGNOSIS — I129 Hypertensive chronic kidney disease with stage 1 through stage 4 chronic kidney disease, or unspecified chronic kidney disease: Secondary | ICD-10-CM | POA: Diagnosis not present

## 2020-07-18 DIAGNOSIS — F419 Anxiety disorder, unspecified: Secondary | ICD-10-CM | POA: Diagnosis not present

## 2020-07-18 DIAGNOSIS — I5033 Acute on chronic diastolic (congestive) heart failure: Secondary | ICD-10-CM | POA: Diagnosis not present

## 2020-07-18 DIAGNOSIS — E785 Hyperlipidemia, unspecified: Secondary | ICD-10-CM | POA: Diagnosis not present

## 2020-07-18 DIAGNOSIS — J449 Chronic obstructive pulmonary disease, unspecified: Secondary | ICD-10-CM | POA: Diagnosis not present

## 2020-07-18 DIAGNOSIS — D649 Anemia, unspecified: Secondary | ICD-10-CM | POA: Diagnosis not present

## 2020-07-18 DIAGNOSIS — J441 Chronic obstructive pulmonary disease with (acute) exacerbation: Secondary | ICD-10-CM | POA: Diagnosis not present

## 2020-07-18 DIAGNOSIS — Z7409 Other reduced mobility: Secondary | ICD-10-CM | POA: Diagnosis not present

## 2020-07-18 DIAGNOSIS — E876 Hypokalemia: Secondary | ICD-10-CM | POA: Diagnosis not present

## 2020-07-18 DIAGNOSIS — R531 Weakness: Secondary | ICD-10-CM | POA: Diagnosis not present

## 2020-07-19 DIAGNOSIS — F419 Anxiety disorder, unspecified: Secondary | ICD-10-CM | POA: Diagnosis not present

## 2020-07-19 DIAGNOSIS — J441 Chronic obstructive pulmonary disease with (acute) exacerbation: Secondary | ICD-10-CM | POA: Diagnosis not present

## 2020-07-19 DIAGNOSIS — Z720 Tobacco use: Secondary | ICD-10-CM | POA: Diagnosis not present

## 2020-07-19 DIAGNOSIS — K219 Gastro-esophageal reflux disease without esophagitis: Secondary | ICD-10-CM | POA: Diagnosis not present

## 2020-07-19 DIAGNOSIS — J101 Influenza due to other identified influenza virus with other respiratory manifestations: Secondary | ICD-10-CM | POA: Diagnosis not present

## 2020-07-19 DIAGNOSIS — J9601 Acute respiratory failure with hypoxia: Secondary | ICD-10-CM | POA: Diagnosis not present

## 2020-07-19 DIAGNOSIS — I10 Essential (primary) hypertension: Secondary | ICD-10-CM | POA: Diagnosis not present

## 2020-07-19 DIAGNOSIS — E876 Hypokalemia: Secondary | ICD-10-CM | POA: Diagnosis not present

## 2020-07-20 DIAGNOSIS — R0600 Dyspnea, unspecified: Secondary | ICD-10-CM | POA: Diagnosis not present

## 2020-07-20 DIAGNOSIS — R062 Wheezing: Secondary | ICD-10-CM | POA: Diagnosis not present

## 2020-07-28 DIAGNOSIS — E785 Hyperlipidemia, unspecified: Secondary | ICD-10-CM | POA: Diagnosis not present

## 2020-07-28 DIAGNOSIS — N183 Chronic kidney disease, stage 3 unspecified: Secondary | ICD-10-CM | POA: Diagnosis not present

## 2020-07-28 DIAGNOSIS — E860 Dehydration: Secondary | ICD-10-CM | POA: Diagnosis not present

## 2020-07-28 DIAGNOSIS — I129 Hypertensive chronic kidney disease with stage 1 through stage 4 chronic kidney disease, or unspecified chronic kidney disease: Secondary | ICD-10-CM | POA: Diagnosis not present

## 2020-07-28 DIAGNOSIS — K219 Gastro-esophageal reflux disease without esophagitis: Secondary | ICD-10-CM | POA: Diagnosis not present

## 2020-07-28 DIAGNOSIS — F17210 Nicotine dependence, cigarettes, uncomplicated: Secondary | ICD-10-CM | POA: Diagnosis not present

## 2020-07-28 DIAGNOSIS — J9601 Acute respiratory failure with hypoxia: Secondary | ICD-10-CM | POA: Diagnosis not present

## 2020-07-28 DIAGNOSIS — I10 Essential (primary) hypertension: Secondary | ICD-10-CM | POA: Diagnosis not present

## 2020-07-28 DIAGNOSIS — I739 Peripheral vascular disease, unspecified: Secondary | ICD-10-CM | POA: Diagnosis not present

## 2020-07-28 DIAGNOSIS — J101 Influenza due to other identified influenza virus with other respiratory manifestations: Secondary | ICD-10-CM | POA: Diagnosis not present

## 2020-08-02 DIAGNOSIS — G894 Chronic pain syndrome: Secondary | ICD-10-CM | POA: Diagnosis not present

## 2020-08-02 DIAGNOSIS — F5101 Primary insomnia: Secondary | ICD-10-CM | POA: Diagnosis not present

## 2020-08-02 DIAGNOSIS — R5383 Other fatigue: Secondary | ICD-10-CM | POA: Diagnosis not present

## 2020-08-02 DIAGNOSIS — R5381 Other malaise: Secondary | ICD-10-CM | POA: Diagnosis not present

## 2020-08-02 DIAGNOSIS — Z79899 Other long term (current) drug therapy: Secondary | ICD-10-CM | POA: Diagnosis not present

## 2020-08-02 DIAGNOSIS — I129 Hypertensive chronic kidney disease with stage 1 through stage 4 chronic kidney disease, or unspecified chronic kidney disease: Secondary | ICD-10-CM | POA: Diagnosis not present

## 2020-08-02 DIAGNOSIS — M5136 Other intervertebral disc degeneration, lumbar region: Secondary | ICD-10-CM | POA: Diagnosis not present

## 2020-08-02 DIAGNOSIS — Z720 Tobacco use: Secondary | ICD-10-CM | POA: Diagnosis not present

## 2020-08-02 DIAGNOSIS — J449 Chronic obstructive pulmonary disease, unspecified: Secondary | ICD-10-CM | POA: Diagnosis not present

## 2020-08-02 DIAGNOSIS — E538 Deficiency of other specified B group vitamins: Secondary | ICD-10-CM | POA: Diagnosis not present

## 2020-08-02 DIAGNOSIS — K219 Gastro-esophageal reflux disease without esophagitis: Secondary | ICD-10-CM | POA: Diagnosis not present

## 2020-08-02 DIAGNOSIS — F1190 Opioid use, unspecified, uncomplicated: Secondary | ICD-10-CM | POA: Diagnosis not present

## 2020-08-02 DIAGNOSIS — I739 Peripheral vascular disease, unspecified: Secondary | ICD-10-CM | POA: Diagnosis not present

## 2020-08-02 DIAGNOSIS — N1831 Chronic kidney disease, stage 3a: Secondary | ICD-10-CM | POA: Diagnosis not present

## 2020-08-27 DIAGNOSIS — E860 Dehydration: Secondary | ICD-10-CM | POA: Diagnosis not present

## 2020-08-27 DIAGNOSIS — J9601 Acute respiratory failure with hypoxia: Secondary | ICD-10-CM | POA: Diagnosis not present

## 2020-08-27 DIAGNOSIS — I129 Hypertensive chronic kidney disease with stage 1 through stage 4 chronic kidney disease, or unspecified chronic kidney disease: Secondary | ICD-10-CM | POA: Diagnosis not present

## 2020-08-27 DIAGNOSIS — J101 Influenza due to other identified influenza virus with other respiratory manifestations: Secondary | ICD-10-CM | POA: Diagnosis not present

## 2020-08-27 DIAGNOSIS — I739 Peripheral vascular disease, unspecified: Secondary | ICD-10-CM | POA: Diagnosis not present

## 2020-08-27 DIAGNOSIS — K219 Gastro-esophageal reflux disease without esophagitis: Secondary | ICD-10-CM | POA: Diagnosis not present

## 2020-08-27 DIAGNOSIS — N183 Chronic kidney disease, stage 3 unspecified: Secondary | ICD-10-CM | POA: Diagnosis not present

## 2020-08-27 DIAGNOSIS — F17210 Nicotine dependence, cigarettes, uncomplicated: Secondary | ICD-10-CM | POA: Diagnosis not present

## 2020-08-27 DIAGNOSIS — E785 Hyperlipidemia, unspecified: Secondary | ICD-10-CM | POA: Diagnosis not present

## 2020-09-02 DIAGNOSIS — M5416 Radiculopathy, lumbar region: Secondary | ICD-10-CM | POA: Diagnosis not present

## 2020-09-02 DIAGNOSIS — K219 Gastro-esophageal reflux disease without esophagitis: Secondary | ICD-10-CM | POA: Diagnosis not present

## 2020-09-02 DIAGNOSIS — E782 Mixed hyperlipidemia: Secondary | ICD-10-CM | POA: Diagnosis not present

## 2020-09-02 DIAGNOSIS — J449 Chronic obstructive pulmonary disease, unspecified: Secondary | ICD-10-CM | POA: Diagnosis not present

## 2020-09-02 DIAGNOSIS — Z79899 Other long term (current) drug therapy: Secondary | ICD-10-CM | POA: Diagnosis not present

## 2020-09-02 DIAGNOSIS — M5136 Other intervertebral disc degeneration, lumbar region: Secondary | ICD-10-CM | POA: Diagnosis not present

## 2020-09-02 DIAGNOSIS — R5383 Other fatigue: Secondary | ICD-10-CM | POA: Diagnosis not present

## 2020-09-02 DIAGNOSIS — I739 Peripheral vascular disease, unspecified: Secondary | ICD-10-CM | POA: Diagnosis not present

## 2020-09-02 DIAGNOSIS — R5381 Other malaise: Secondary | ICD-10-CM | POA: Diagnosis not present

## 2020-09-02 DIAGNOSIS — N1831 Chronic kidney disease, stage 3a: Secondary | ICD-10-CM | POA: Diagnosis not present

## 2020-09-02 DIAGNOSIS — F5101 Primary insomnia: Secondary | ICD-10-CM | POA: Diagnosis not present

## 2020-09-02 DIAGNOSIS — I129 Hypertensive chronic kidney disease with stage 1 through stage 4 chronic kidney disease, or unspecified chronic kidney disease: Secondary | ICD-10-CM | POA: Diagnosis not present

## 2020-09-08 DIAGNOSIS — M5416 Radiculopathy, lumbar region: Secondary | ICD-10-CM | POA: Diagnosis not present

## 2020-09-09 DIAGNOSIS — Z9889 Other specified postprocedural states: Secondary | ICD-10-CM | POA: Diagnosis not present

## 2020-09-09 DIAGNOSIS — M5416 Radiculopathy, lumbar region: Secondary | ICD-10-CM | POA: Diagnosis not present

## 2020-09-09 DIAGNOSIS — M5136 Other intervertebral disc degeneration, lumbar region: Secondary | ICD-10-CM | POA: Diagnosis not present

## 2020-09-09 DIAGNOSIS — Z981 Arthrodesis status: Secondary | ICD-10-CM | POA: Diagnosis not present

## 2020-09-26 DIAGNOSIS — I129 Hypertensive chronic kidney disease with stage 1 through stage 4 chronic kidney disease, or unspecified chronic kidney disease: Secondary | ICD-10-CM | POA: Diagnosis not present

## 2020-09-26 DIAGNOSIS — E860 Dehydration: Secondary | ICD-10-CM | POA: Diagnosis not present

## 2020-09-26 DIAGNOSIS — J101 Influenza due to other identified influenza virus with other respiratory manifestations: Secondary | ICD-10-CM | POA: Diagnosis not present

## 2020-09-26 DIAGNOSIS — J9601 Acute respiratory failure with hypoxia: Secondary | ICD-10-CM | POA: Diagnosis not present

## 2020-09-26 DIAGNOSIS — N183 Chronic kidney disease, stage 3 unspecified: Secondary | ICD-10-CM | POA: Diagnosis not present

## 2020-09-26 DIAGNOSIS — E785 Hyperlipidemia, unspecified: Secondary | ICD-10-CM | POA: Diagnosis not present

## 2020-09-26 DIAGNOSIS — K219 Gastro-esophageal reflux disease without esophagitis: Secondary | ICD-10-CM | POA: Diagnosis not present

## 2020-09-26 DIAGNOSIS — F17210 Nicotine dependence, cigarettes, uncomplicated: Secondary | ICD-10-CM | POA: Diagnosis not present

## 2020-09-26 DIAGNOSIS — I739 Peripheral vascular disease, unspecified: Secondary | ICD-10-CM | POA: Diagnosis not present

## 2020-10-06 DIAGNOSIS — K219 Gastro-esophageal reflux disease without esophagitis: Secondary | ICD-10-CM | POA: Diagnosis not present

## 2020-10-06 DIAGNOSIS — M5136 Other intervertebral disc degeneration, lumbar region: Secondary | ICD-10-CM | POA: Diagnosis not present

## 2020-10-06 DIAGNOSIS — I129 Hypertensive chronic kidney disease with stage 1 through stage 4 chronic kidney disease, or unspecified chronic kidney disease: Secondary | ICD-10-CM | POA: Diagnosis not present

## 2020-10-06 DIAGNOSIS — F419 Anxiety disorder, unspecified: Secondary | ICD-10-CM | POA: Diagnosis not present

## 2020-10-06 DIAGNOSIS — R5381 Other malaise: Secondary | ICD-10-CM | POA: Diagnosis not present

## 2020-10-06 DIAGNOSIS — Z720 Tobacco use: Secondary | ICD-10-CM | POA: Diagnosis not present

## 2020-10-06 DIAGNOSIS — N1831 Chronic kidney disease, stage 3a: Secondary | ICD-10-CM | POA: Diagnosis not present

## 2020-10-06 DIAGNOSIS — M5416 Radiculopathy, lumbar region: Secondary | ICD-10-CM | POA: Diagnosis not present

## 2020-10-06 DIAGNOSIS — J449 Chronic obstructive pulmonary disease, unspecified: Secondary | ICD-10-CM | POA: Diagnosis not present

## 2020-10-06 DIAGNOSIS — I739 Peripheral vascular disease, unspecified: Secondary | ICD-10-CM | POA: Diagnosis not present

## 2020-10-06 DIAGNOSIS — R5383 Other fatigue: Secondary | ICD-10-CM | POA: Diagnosis not present

## 2020-10-06 DIAGNOSIS — Z79899 Other long term (current) drug therapy: Secondary | ICD-10-CM | POA: Diagnosis not present

## 2020-10-14 DIAGNOSIS — R531 Weakness: Secondary | ICD-10-CM | POA: Diagnosis not present

## 2020-10-14 DIAGNOSIS — I10 Essential (primary) hypertension: Secondary | ICD-10-CM | POA: Diagnosis not present

## 2020-10-14 DIAGNOSIS — R110 Nausea: Secondary | ICD-10-CM | POA: Diagnosis not present

## 2020-10-14 DIAGNOSIS — G8929 Other chronic pain: Secondary | ICD-10-CM | POA: Diagnosis not present

## 2020-10-14 DIAGNOSIS — R600 Localized edema: Secondary | ICD-10-CM | POA: Diagnosis not present

## 2020-10-14 DIAGNOSIS — R109 Unspecified abdominal pain: Secondary | ICD-10-CM | POA: Diagnosis not present

## 2020-10-14 DIAGNOSIS — R339 Retention of urine, unspecified: Secondary | ICD-10-CM | POA: Diagnosis not present

## 2020-10-14 DIAGNOSIS — M545 Low back pain, unspecified: Secondary | ICD-10-CM | POA: Diagnosis not present

## 2020-10-14 DIAGNOSIS — R1111 Vomiting without nausea: Secondary | ICD-10-CM | POA: Diagnosis not present

## 2020-10-14 DIAGNOSIS — R1110 Vomiting, unspecified: Secondary | ICD-10-CM | POA: Diagnosis not present

## 2020-10-14 DIAGNOSIS — K760 Fatty (change of) liver, not elsewhere classified: Secondary | ICD-10-CM | POA: Diagnosis not present

## 2020-10-16 DIAGNOSIS — Z6827 Body mass index (BMI) 27.0-27.9, adult: Secondary | ICD-10-CM | POA: Diagnosis not present

## 2020-10-16 DIAGNOSIS — R531 Weakness: Secondary | ICD-10-CM | POA: Diagnosis not present

## 2020-10-16 DIAGNOSIS — R630 Anorexia: Secondary | ICD-10-CM | POA: Diagnosis not present

## 2020-10-16 DIAGNOSIS — E860 Dehydration: Secondary | ICD-10-CM | POA: Diagnosis not present

## 2020-10-16 DIAGNOSIS — R1084 Generalized abdominal pain: Secondary | ICD-10-CM | POA: Diagnosis not present

## 2020-10-16 DIAGNOSIS — R1030 Lower abdominal pain, unspecified: Secondary | ICD-10-CM | POA: Diagnosis not present

## 2020-10-16 DIAGNOSIS — R109 Unspecified abdominal pain: Secondary | ICD-10-CM | POA: Diagnosis not present

## 2020-11-27 DEATH — deceased
